# Patient Record
Sex: Male | Born: 1950 | Race: White | Hispanic: No | Marital: Married | Smoking: Former smoker
Health system: Southern US, Community
[De-identification: ages and names within clinical notes are randomized; demographics above are authoritative.]

## PROBLEM LIST (undated history)

## (undated) DIAGNOSIS — K219 Gastro-esophageal reflux disease without esophagitis: Secondary | ICD-10-CM

## (undated) DIAGNOSIS — M1990 Unspecified osteoarthritis, unspecified site: Secondary | ICD-10-CM

## (undated) DIAGNOSIS — Z860100 Personal history of colon polyps, unspecified: Secondary | ICD-10-CM

## (undated) DIAGNOSIS — F319 Bipolar disorder, unspecified: Secondary | ICD-10-CM

## (undated) DIAGNOSIS — I10 Essential (primary) hypertension: Secondary | ICD-10-CM

## (undated) DIAGNOSIS — Z8719 Personal history of other diseases of the digestive system: Secondary | ICD-10-CM

## (undated) DIAGNOSIS — Z9889 Other specified postprocedural states: Secondary | ICD-10-CM

## (undated) HISTORY — DX: Personal history of colon polyps, unspecified: Z86.0100

## (undated) HISTORY — DX: Personal history of colonic polyps: Z86.010

## (undated) HISTORY — DX: Unspecified osteoarthritis, unspecified site: M19.90

## (undated) HISTORY — DX: Gastro-esophageal reflux disease without esophagitis: K21.9

## (undated) HISTORY — DX: Bipolar disorder, unspecified: F31.9

## (undated) HISTORY — DX: Other specified postprocedural states: Z98.890

## (undated) HISTORY — PX: VASECTOMY: SHX75

## (undated) HISTORY — DX: Personal history of other diseases of the digestive system: Z87.19

---

## 2000-08-12 ENCOUNTER — Encounter: Admission: RE | Admit: 2000-08-12 | Discharge: 2000-08-12 | Payer: Self-pay | Admitting: Family Medicine

## 2000-08-12 ENCOUNTER — Encounter: Payer: Self-pay | Admitting: Family Medicine

## 2000-09-18 ENCOUNTER — Ambulatory Visit (HOSPITAL_BASED_OUTPATIENT_CLINIC_OR_DEPARTMENT_OTHER): Admission: RE | Admit: 2000-09-18 | Discharge: 2000-09-18 | Payer: Self-pay | Admitting: Otolaryngology

## 2001-07-06 ENCOUNTER — Encounter: Admission: RE | Admit: 2001-07-06 | Discharge: 2001-07-06 | Payer: Self-pay | Admitting: Family Medicine

## 2001-07-06 ENCOUNTER — Encounter: Payer: Self-pay | Admitting: Family Medicine

## 2001-07-21 ENCOUNTER — Ambulatory Visit (HOSPITAL_BASED_OUTPATIENT_CLINIC_OR_DEPARTMENT_OTHER): Admission: RE | Admit: 2001-07-21 | Discharge: 2001-07-21 | Payer: Self-pay | Admitting: Family Medicine

## 2001-08-04 ENCOUNTER — Encounter: Admission: RE | Admit: 2001-08-04 | Discharge: 2001-08-04 | Payer: Self-pay | Admitting: Family Medicine

## 2001-08-04 ENCOUNTER — Encounter: Payer: Self-pay | Admitting: Family Medicine

## 2001-08-24 ENCOUNTER — Ambulatory Visit (HOSPITAL_COMMUNITY): Admission: RE | Admit: 2001-08-24 | Discharge: 2001-08-24 | Payer: Self-pay | Admitting: *Deleted

## 2001-08-25 ENCOUNTER — Ambulatory Visit (HOSPITAL_BASED_OUTPATIENT_CLINIC_OR_DEPARTMENT_OTHER): Admission: RE | Admit: 2001-08-25 | Discharge: 2001-08-25 | Payer: Self-pay | Admitting: Family Medicine

## 2003-03-25 ENCOUNTER — Observation Stay (HOSPITAL_COMMUNITY): Admission: EM | Admit: 2003-03-25 | Discharge: 2003-03-26 | Payer: Self-pay | Admitting: *Deleted

## 2003-03-25 ENCOUNTER — Encounter: Payer: Self-pay | Admitting: Cardiology

## 2004-03-23 ENCOUNTER — Ambulatory Visit (HOSPITAL_BASED_OUTPATIENT_CLINIC_OR_DEPARTMENT_OTHER): Admission: RE | Admit: 2004-03-23 | Discharge: 2004-03-23 | Payer: Self-pay | Admitting: Orthopedic Surgery

## 2004-12-25 ENCOUNTER — Emergency Department (HOSPITAL_COMMUNITY): Admission: EM | Admit: 2004-12-25 | Discharge: 2004-12-25 | Payer: Self-pay | Admitting: Family Medicine

## 2005-09-30 HISTORY — PX: HERNIA REPAIR: SHX51

## 2006-09-19 ENCOUNTER — Ambulatory Visit (HOSPITAL_COMMUNITY): Admission: RE | Admit: 2006-09-19 | Discharge: 2006-09-19 | Payer: Self-pay | Admitting: Surgery

## 2011-06-17 ENCOUNTER — Encounter (INDEPENDENT_AMBULATORY_CARE_PROVIDER_SITE_OTHER): Payer: 59 | Admitting: Surgery

## 2011-07-15 ENCOUNTER — Encounter (INDEPENDENT_AMBULATORY_CARE_PROVIDER_SITE_OTHER): Payer: Self-pay | Admitting: Surgery

## 2011-07-15 ENCOUNTER — Ambulatory Visit (INDEPENDENT_AMBULATORY_CARE_PROVIDER_SITE_OTHER): Payer: 59 | Admitting: Surgery

## 2011-07-15 VITALS — BP 124/78 | HR 88 | Temp 97.6°F | Resp 20 | Ht 72.0 in | Wt 186.0 lb

## 2011-07-15 DIAGNOSIS — K4090 Unilateral inguinal hernia, without obstruction or gangrene, not specified as recurrent: Secondary | ICD-10-CM | POA: Insufficient documentation

## 2011-07-15 NOTE — Patient Instructions (Signed)
Inguinal Hernia, Adult Muscles help keep everything in the body in its proper place. But if a weak spot in the muscles develops, something can poke through. That is called a hernia. When this happens in the lower part of the belly (abdomen), it is called an inguinal hernia. (It takes its name from a part of the body in this region called the inguinal canal.) A weak spot in the wall of muscles lets some fat or part of the small intestine bulge through. An inguinal hernia can develop at any age. Men get them more often than women. CAUSES In adults, an inguinal hernia develops over time.  It can be triggered by:   Suddenly straining the muscles of the lower abdomen.   Lifting heavy objects.   Straining to have a bowel movement. Difficult bowel movements (constipation) can lead to this.   Constant coughing. This may be caused by smoking or lung disease.   Being overweight.   Being pregnant.   Working at a job that requires long periods of standing or heavy lifting.   Having had an inguinal hernia before.  One type can be an emergency situation. It is called a strangulated inguinal hernia. It develops if part of the small intestine slips through the weak spot and cannot get back into the abdomen. The blood supply can be cut off. If that happens, part of the intestine may die. This situation requires emergency surgery. SYMPTOMS Often, a small inguinal hernia has no symptoms. It is found when a healthcare provider does a physical exam. Larger hernias usually have symptoms.   In adults, symptoms may include:   A lump in the groin. This is easier to see when the person is standing. It might disappear when lying down.   In men, a lump in the scrotum.   Pain or burning in the groin. This occurs especially when lifting, straining or coughing.   A dull ache or feeling of pressure in the groin.   Signs of a strangulated hernia can include:   A bulge in the groin that becomes very painful and  tender to the touch.   A bulge that turns red or purple.   Fever, nausea and vomiting.   Inability to have a bowel movement or to pass gas.  DIAGNOSIS To decide if you have an inguinal hernia, a healthcare provider will probably do a physical examination.  This will include asking questions about any symptoms you have noticed.   The healthcare provider might feel the groin area and ask you to cough. If an inguinal hernia is felt, the healthcare provider may try to slide it back into the abdomen.   Usually no other tests are needed.  TREATMENT Treatments can vary. The size of the hernia makes a difference. Options include:  Watchful waiting. This is often suggested if the hernia is small and you have had no symptoms.   No medical procedure will be done unless symptoms develop.   You will need to watch closely for symptoms. If any occur, contact your healthcare provider right away.   Surgery. This is used if the hernia is larger or you have symptoms.   Open surgery. This is usually an outpatient procedure (you will not stay overnight in a hospital). An cut (incision) is made through the skin in the groin. The hernia is put back inside the abdomen. The weak area in the muscles is then repaired by:  --Herniorrhaphy. In this type of surgery, the weak muscles are sewn   back together. --Hernioplasty. A patch or mesh is used to close the weak area in the abdominal wall.   Laparoscopy. In this procedure, a surgeon makes small incisions. A thin tube with a tiny video camera (called a laparoscope) is put into the abdomen. The surgeon repairs the hernia with mesh by looking with the video camera and using two long instruments.  HOME CARE INSTRUCTIONS  After surgery to repair an inguinal hernia:   You will need to take pain medicine prescribed by your healthcare provider. Follow all directions carefully.   You will need to take care of the wound from the incision.   Your activity will be  restricted for awhile. This will probably include no heavy lifting for several weeks. You also should not do anything too active for a few weeks. When you can return to work will depend on the type of job that you have.   During "watchful waiting" periods, you should:   Maintain a healthy weight.   Eat a diet high in fiber (fruits, vegetables and whole grains).   Drink plenty of fluids to avoid constipation. This means drinking enough water and other liquids to keep your urine clear or pale yellow.   Do not lift heavy objects.   Do not stand for long periods of time.   Quit smoking. This should keep you from developing a frequent cough.  SEEK MEDICAL CARE IF:  A bulge develops in your groin area.   You feel pain, a burning sensation or pressure in the groin. This might be worse if you are lifting or straining.   You develop a fever of more than 100.5F (38.1 C).  SEEK IMMEDIATE MEDICAL CARE IF:  Pain in the groin increases suddenly.   A bulge in the groin gets bigger suddenly and does not go down.   For men, there is sudden pain in the scrotum. Or, the size of the scrotum increases.   A bulge in the groin area becomes red or purple and is painful to touch.   You have nausea or vomiting that does not go away.   You feel your heart beating much faster than normal.   You cannot have a bowel movement or pass gas.   You develop a fever of more than 102.0F (38.9C).  Document Released: 02/02/2009 Document Re-Released: 03/06/2010 [employer] Patient Information 2011 [employer]. 

## 2011-07-15 NOTE — Progress Notes (Signed)
Subjective:     Patient ID: Justin Waters, male   DOB: 05-12-1951, 60 y.o.   MRN: [id number]  HPI  Patient Care Team: Eddie Candle, PA as PCP - General (Internal Medicine)  This patient is a 60 y.o.male who presents today for surgical evaluation.   Reason for visit: Left groin pain. Probable hernia.  Patient is a pleasant gentleman that I repaired a right inguinal hernia on laparoscopically in 2007. He recovered from that well.  A month ago he started feeling discomfort in his left groin, a dull ache like a toothache. It is intermittent. It is worse with coughing, straining, and activity. It feels just like how his right inguinal hernia started.  No bouts of constipation or diarrhea. No dysuria or hematuria. Otherwise been okay.  He was evaluated at the [facility]. They suspected left inguinal hernia. They sent the patient to me for evaluation.   Past Medical History  Diagnosis Date  . Lt groin pain   . Bipolar 1 disorder     ?? off meds 2012    Past Surgical History  Procedure Date  . Hernia repair 2007    lap RIH TEP    History   Social History  . Marital Status: Married    Spouse Name: N/A    Number of Children: N/A  . Years of Education: N/A   Occupational History  . Not on file.   Social History Main Topics  . Smoking status: Former Games developer  . Smokeless tobacco: Not on file  . Alcohol Use: Yes  . Drug Use: No  . Sexually Active:    Other Topics Concern  . Not on file   Social History Narrative  . No narrative on file    History reviewed. No pertinent family history.  No current outpatient prescriptions on file.  No Known Allergies    Review of Systems  Constitutional: Negative for fever, chills and diaphoresis.  HENT: Negative for nosebleeds, sore throat, facial swelling, mouth sores, trouble swallowing and ear discharge.   Eyes: Negative for photophobia, discharge and visual disturbance.  Respiratory: Negative for choking, chest  tightness, shortness of breath and stridor.   Cardiovascular: Negative for chest pain, palpitations and leg swelling.       Walks 2 miles w/o difficulty  Gastrointestinal: Negative for nausea, vomiting, abdominal pain, diarrhea, constipation, blood in stool, abdominal distention, anal bleeding and rectal pain.  Genitourinary: Negative for dysuria, urgency, difficulty urinating and testicular pain.  Musculoskeletal: Negative for myalgias, back pain, arthralgias and gait problem.  Skin: Negative for color change, pallor, rash and wound.  Neurological: Negative for dizziness, speech difficulty, weakness, numbness and headaches.  Hematological: Negative for adenopathy. Does not bruise/bleed easily.  Psychiatric/Behavioral: Negative for hallucinations, confusion and agitation.       Objective:   Physical Exam  Constitutional: He is oriented to person, place, and time. He appears well-developed and well-nourished. No distress.  HENT:  Head: Normocephalic.  Mouth/Throat: Oropharynx is clear and moist. No oropharyngeal exudate.  Eyes: Conjunctivae and EOM are normal. Pupils are equal, round, and reactive to light. No scleral icterus.  Neck: Normal range of motion. Neck supple. No tracheal deviation present.  Cardiovascular: Normal rate, regular rhythm, normal heart sounds and intact distal pulses.   Pulmonary/Chest: Effort normal and breath sounds normal. No respiratory distress.  Abdominal: Soft. He exhibits no distension and no mass. There is no tenderness. Hernia confirmed negative in the right inguinal area.  Incisions clean with normal healing ridges.    Genitourinary:     Musculoskeletal: Normal range of motion. He exhibits no tenderness.  Lymphadenopathy:    He has no cervical adenopathy.       Right: No inguinal adenopathy present.       Left: No inguinal adenopathy present.  Neurological: He is alert and oriented to person, place, and time. No cranial nerve deficit. He  exhibits normal muscle tone. Coordination normal.  Skin: Skin is warm and dry. No rash noted. He is not diaphoretic. No erythema. No pallor.  Psychiatric: He has a normal mood and affect. His behavior is normal. Judgment and thought content normal.       Assessment:     LIH, poss pantaloon type    Plan:     The anatomy & physiology of the abdominal wall was discussed.  The pathophysiology of hernias was discussed.  Natural history risks without surgery of enlargement, pain, incarceration & strangulation was discussed.   Contributors to complications such as smoking, obesity, diabetes, prior surgery, etc were discussed.  I feel the risks of no intervention will lead to serious problems that outweigh the operative risks; therefore, I recommended surgery to reduce and repair the [facility] hernia.  I explained laparoscopic techniques with possible need for an open approach.  I noted the probable use of mesh to patch and/or buttress hernia repair.  It make take a little longer with the prior Lap RIH repair in 2007  Risks such as bleeding, infection, abscess, need for further treatment, heart attack, death, and other risks were discussed.  Goals of post-operative recovery were discussed as well.  Possibility that this will not correct all symptoms was explained.  I stressed the importance of low-impact activity, aggressive pain control, avoiding constipation, & not pushing through pain to minimize risk of post-operative chronic pain or injury. Possibility of reherniation was discussed.  We will work to minimize complications.   An educational handout further explaining the pathology & treatment options was given as well.  Questions were answered.  The patient expresses understanding & wishes to proceed with surgery.

## 2011-08-20 DIAGNOSIS — K4090 Unilateral inguinal hernia, without obstruction or gangrene, not specified as recurrent: Secondary | ICD-10-CM

## 2011-08-20 HISTORY — PX: HERNIA REPAIR: SHX51

## 2011-09-05 ENCOUNTER — Encounter (INDEPENDENT_AMBULATORY_CARE_PROVIDER_SITE_OTHER): Payer: Self-pay | Admitting: Surgery

## 2011-09-05 ENCOUNTER — Ambulatory Visit (INDEPENDENT_AMBULATORY_CARE_PROVIDER_SITE_OTHER): Payer: 59 | Admitting: Surgery

## 2011-09-05 VITALS — BP 134/76 | HR 76 | Temp 98.1°F | Resp 24 | Ht 72.0 in | Wt 191.4 lb

## 2011-09-05 DIAGNOSIS — K4090 Unilateral inguinal hernia, without obstruction or gangrene, not specified as recurrent: Secondary | ICD-10-CM

## 2011-09-05 NOTE — Patient Instructions (Signed)

## 2011-09-05 NOTE — Progress Notes (Signed)
Subjective:     Patient ID: Justin Waters, male   DOB: 10-27-1950, 60 y.o.   MRN: [id number]  HPI  Jessica Seidman [facility]  04/22/1951 [id number]  Patient Care Team: Eddie Candle, PA as PCP - General (Internal Medicine)  This patient is a 60 y.o.male who presents today for surgical evaluation.   Procedure: Laparoscopic left inguinal hernia 08/20/2011  The patient comes in today doing well. He never took any narcotics. He notes he was sore that first week but did not want to take anything. He is already back to work. He had some bruising but it is nearly resolved. Urinating fine. Regular bowel movements. Eating well. Has had some occasional sharp pains with sneezing and coughing but they are getting milder already.  Patient Active Problem List  Diagnoses  (none) - all problems resolved or deleted    Past Medical History  Diagnosis Date  . Bipolar 1 disorder     ?? off meds 2012  . History of inguinal hernia repair     Past Surgical History  Procedure Date  . Hernia repair 2007    lap RIH TEP  . Hernia repair 08/20/2011    lap LIH    History   Social History  . Marital Status: Married    Spouse Name: N/A    Number of Children: N/A  . Years of Education: N/A   Occupational History  . Not on file.   Social History Main Topics  . Smoking status: Former Games developer  . Smokeless tobacco: Not on file  . Alcohol Use: Yes  . Drug Use: No  . Sexually Active:    Other Topics Concern  . Not on file   Social History Narrative  . No narrative on file    History reviewed. No pertinent family history.  No current outpatient prescriptions on file.  No Known Allergies  BP 134/76  Pulse 76  Temp(Src) 98.1 F (36.7 C) (Temporal)  Resp 24  Ht 6' (1.829 m)  Wt 191 lb 6.4 oz (86.818 kg)  BMI 25.96 kg/m2     Review of Systems  Constitutional: Negative for fever, chills and diaphoresis.  HENT: Negative for sore throat, trouble swallowing and neck pain.   Eyes: Negative for  photophobia and visual disturbance.  Respiratory: Negative for choking and shortness of breath.   Cardiovascular: Negative for chest pain and palpitations.  Gastrointestinal: Negative for nausea, vomiting, abdominal distention, anal bleeding and rectal pain.  Genitourinary: Negative for dysuria, urgency, penile swelling, scrotal swelling, difficulty urinating, penile pain and testicular pain.  Musculoskeletal: Negative for myalgias, arthralgias and gait problem.  Skin: Negative for color change and rash.  Neurological: Negative for dizziness, speech difficulty, weakness and numbness.  Hematological: Negative for adenopathy.  Psychiatric/Behavioral: Negative for hallucinations, confusion and agitation.       Objective:   Physical Exam  Constitutional: He is oriented to person, place, and time. He appears well-developed and well-nourished. No distress.  HENT:  Head: Normocephalic.  Mouth/Throat: Oropharynx is clear and moist. No oropharyngeal exudate.  Eyes: Conjunctivae and EOM are normal. Pupils are equal, round, and reactive to light. No scleral icterus.  Neck: Normal range of motion. No tracheal deviation present.  Cardiovascular: Normal rate, normal heart sounds and intact distal pulses.   Pulmonary/Chest: Effort normal. No respiratory distress.  Abdominal: Soft. He exhibits no distension. There is no tenderness. Hernia confirmed negative in the right inguinal area and confirmed negative in the left inguinal area.  Incisions clean with normal healing ridges.  No hernias  Genitourinary: Penis normal. Right testis shows no mass. Left testis shows no mass. Circumcised. No penile tenderness.       Mild L thigh ecchymosis - nearly gone  Musculoskeletal: Normal range of motion. He exhibits no tenderness.  Neurological: He is alert and oriented to person, place, and time. No cranial nerve deficit. He exhibits normal muscle tone. Coordination normal.  Skin: Skin is warm and dry. No rash  noted. He is not diaphoretic.  Psychiatric: He has a normal mood and affect. His behavior is normal.       Assessment:     2 weeks s/p lap LIH repair with prior lap RIH, recovering well    Plan:     Increase activity as tolerated.  Do not push through pain.  Advanced on diet as tolerated. Bowel regimen to avoid problems.  Return to clinic p.r.n. The patient expressed understanding and appreciation

## 2012-01-03 ENCOUNTER — Encounter (INDEPENDENT_AMBULATORY_CARE_PROVIDER_SITE_OTHER): Payer: Self-pay | Admitting: Surgery

## 2012-01-23 ENCOUNTER — Ambulatory Visit (INDEPENDENT_AMBULATORY_CARE_PROVIDER_SITE_OTHER): Payer: 59 | Admitting: Family Medicine

## 2012-01-23 VITALS — BP 164/77 | HR 69 | Temp 98.3°F | Resp 16 | Ht 70.5 in | Wt 185.4 lb

## 2012-01-23 DIAGNOSIS — IMO0001 Reserved for inherently not codable concepts without codable children: Secondary | ICD-10-CM

## 2012-01-23 DIAGNOSIS — Z0000 Encounter for general adult medical examination without abnormal findings: Secondary | ICD-10-CM

## 2012-01-23 DIAGNOSIS — M542 Cervicalgia: Secondary | ICD-10-CM

## 2012-01-23 DIAGNOSIS — R5383 Other fatigue: Secondary | ICD-10-CM

## 2012-01-23 DIAGNOSIS — G8929 Other chronic pain: Secondary | ICD-10-CM

## 2012-01-23 DIAGNOSIS — Z23 Encounter for immunization: Secondary | ICD-10-CM

## 2012-01-23 LAB — POCT CBC
Granulocyte percent: 65.8 %G (ref 37–80)
HCT, POC: 45.5 % (ref 43.5–53.7)
Hemoglobin: 14.3 g/dL (ref 14.1–18.1)
Lymph, poc: 2.9 (ref 0.6–3.4)
MCH, POC: 27.4 pg (ref 27–31.2)
MCHC: 31.4 g/dL — AB (ref 31.8–35.4)
MCV: 87.2 fL (ref 80–97)
MID (cbc): 0.8 (ref 0–0.9)
MPV: 9.2 fL (ref 0–99.8)
POC Granulocyte: 7 — AB (ref 2–6.9)
POC LYMPH PERCENT: 26.9 %L (ref 10–50)
POC MID %: 7.3 %M (ref 0–12)
Platelet Count, POC: 310 10*3/uL (ref 142–424)
RBC: 5.22 M/uL (ref 4.69–6.13)
RDW, POC: 15 %
WBC: 10.6 10*3/uL — AB (ref 4.6–10.2)

## 2012-01-23 LAB — IFOBT (OCCULT BLOOD): IFOBT: NEGATIVE

## 2012-01-23 MED ORDER — CYCLOBENZAPRINE HCL 10 MG PO TABS: 10.0000 mg | ORAL_TABLET | Freq: Every evening | ORAL | Status: DC | PRN

## 2012-01-23 MED ORDER — MELOXICAM 7.5 MG PO TABS: 7.5000 mg | ORAL_TABLET | Freq: Two times a day (BID) | ORAL | Status: DC

## 2012-01-23 MED ORDER — GABAPENTIN 100 MG PO CAPS: ORAL_CAPSULE | ORAL | Status: DC

## 2012-01-23 NOTE — Progress Notes (Signed)
  Subjective:    Patient ID: Justin Waters, male    DOB: 07/23/1951, 61 y.o.   MRN: [id number]  HPI Patient presents for CPE with several concerns  1) Fatigue- last several weeks; comes home from work and takes nap then eats dinner and goes to bed. History of mood disorder and is currently off medications. Denies depressive symptoms.  2) Chronic neck and thorasic back pain- no known injury; patient denies radiation of pain to UE. X rays in the past were normal per patient.  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  Review of Systems    see intake form Objective:   Physical Exam  Constitutional: He appears well-developed.  HENT:  Head: Normocephalic.  Right Ear: External ear normal.  Left Ear: External ear normal.  Nose: Nose normal.  Mouth/Throat: Oropharynx is clear and moist.  Eyes: Conjunctivae and EOM are normal. Pupils are equal, round, and reactive to light.  Neck: Normal range of motion. Neck supple. No thyromegaly present.  Cardiovascular: Normal rate, regular rhythm, normal heart sounds and intact distal pulses.   Pulmonary/Chest: Effort normal and breath sounds normal.  Abdominal: Soft. Bowel sounds are normal. There is no hepatosplenomegaly.  Genitourinary: Rectum normal and prostate normal. Guaiac negative stool.  Musculoskeletal: Normal range of motion.  Lymphadenopathy:    He has no cervical adenopathy.  Neurological: He is alert. He displays normal reflexes. No cranial nerve deficit. He exhibits normal muscle tone. Coordination normal.  Skin: Skin is warm.  Psychiatric: He has a normal mood and affect.          Assessment & Plan:   1. Routine general medical examination at a health care facility  Tdap vaccine greater than or equal to 7yo IM, Comprehensive metabolic panel, Lipid panel, PSA, TSH, Testosterone, POCT CBC, IFOBT POC (occult bld, rslt in  office), Ambulatory referral to Gastroenterology  2. Chronic neck pain  meloxicam (MOBIC) 7.5 MG tablet, cyclobenzaprine (FLEXERIL) 10 MG tablet, gabapentin (NEURONTIN) 100 MG capsule, Ambulatory referral to Physical Medicine Rehab  3. Fatigue    4. Elevated BP     Anticipatory guidance

## 2012-01-24 LAB — LIPID PANEL
Cholesterol: 225 mg/dL — ABNORMAL HIGH (ref 0–200)
HDL: 50 mg/dL (ref >39)
LDL Cholesterol: 156 mg/dL — ABNORMAL HIGH (ref 0–99)
Total CHOL/HDL Ratio: 4.5 Ratio
Triglycerides: 95 mg/dL (ref <150)
VLDL: 19 mg/dL (ref 0–40)

## 2012-01-24 LAB — COMPREHENSIVE METABOLIC PANEL
ALT: 17 U/L (ref 0–53)
AST: 23 U/L (ref 0–37)
Albumin: 5 g/dL (ref 3.5–5.2)
Alkaline Phosphatase: 85 U/L (ref 39–117)
BUN: 19 mg/dL (ref 6–23)
CO2: 30 mEq/L (ref 19–32)
Calcium: 9.7 mg/dL (ref 8.4–10.5)
Chloride: 107 mEq/L (ref 96–112)
Creat: 1.03 mg/dL (ref 0.50–1.35)
Glucose, Bld: 117 mg/dL — ABNORMAL HIGH (ref 70–99)
Potassium: 4.5 mEq/L (ref 3.5–5.3)
Sodium: 143 mEq/L (ref 135–145)
Total Bilirubin: 0.4 mg/dL (ref 0.3–1.2)
Total Protein: 7.2 g/dL (ref 6.0–8.3)

## 2012-01-24 LAB — TESTOSTERONE: Testosterone: 265.75 ng/dL — ABNORMAL LOW (ref 300–890)

## 2012-01-24 LAB — PSA: PSA: 1.96 ng/mL (ref <=4.00)

## 2012-01-24 LAB — TSH: TSH: 3.107 u[IU]/mL (ref 0.350–4.500)

## 2012-01-26 ENCOUNTER — Encounter: Payer: Self-pay | Admitting: Family Medicine

## 2012-01-30 ENCOUNTER — Telehealth: Payer: Self-pay | Admitting: Family Medicine

## 2012-01-30 NOTE — Telephone Encounter (Signed)
LM asking for return call.

## 2012-01-30 NOTE — Telephone Encounter (Signed)
Spoke with patient and reviewed labs. Patient to RTC to recheck BP, BS and initiate therapy for hypogonadism.

## 2012-01-30 NOTE — Telephone Encounter (Signed)
.  umfc The patient called to return Dr. Wendee Copp phone call to the patient.  Please call patient at [phone].

## 2012-01-31 ENCOUNTER — Ambulatory Visit (INDEPENDENT_AMBULATORY_CARE_PROVIDER_SITE_OTHER): Payer: 59 | Admitting: Family Medicine

## 2012-01-31 VITALS — BP 148/70 | HR 96 | Temp 98.3°F | Resp 16 | Ht 71.0 in | Wt 184.0 lb

## 2012-01-31 DIAGNOSIS — R739 Hyperglycemia, unspecified: Secondary | ICD-10-CM

## 2012-01-31 DIAGNOSIS — E236 Other disorders of pituitary gland: Secondary | ICD-10-CM

## 2012-01-31 DIAGNOSIS — E291 Testicular hypofunction: Secondary | ICD-10-CM

## 2012-01-31 DIAGNOSIS — E785 Hyperlipidemia, unspecified: Secondary | ICD-10-CM

## 2012-01-31 DIAGNOSIS — M542 Cervicalgia: Secondary | ICD-10-CM

## 2012-01-31 DIAGNOSIS — IMO0001 Reserved for inherently not codable concepts without codable children: Secondary | ICD-10-CM

## 2012-01-31 DIAGNOSIS — I10 Essential (primary) hypertension: Secondary | ICD-10-CM

## 2012-01-31 LAB — GLUCOSE, POCT (MANUAL RESULT ENTRY): POC Glucose: 90

## 2012-01-31 MED ORDER — PRAVASTATIN SODIUM 40 MG PO TABS: 40.0000 mg | ORAL_TABLET | Freq: Every day | ORAL | Status: DC

## 2012-01-31 MED ORDER — TESTOSTERONE 50 MG/5GM (1%) TD GEL: 5.0000 g | Freq: Every day | TRANSDERMAL | Status: DC

## 2012-02-02 NOTE — Progress Notes (Signed)
  Subjective:    Patient ID: Justin Waters, male    DOB: [birth year], 61 y.o.   MRN: [id number]  HPI  Patient presents in follow up of CPE.  Neck pain much improved with Neurontin and flexeril   Review of Systems     Objective:   Physical Exam  Nursing note and vitals reviewed. Constitutional: He appears well-developed.  Neurological: He is alert.     Results for orders placed in visit on 01/31/12  GLUCOSE, POCT (MANUAL RESULT ENTRY)      Component Value Range   POC Glucose 90          Assessment & Plan:   1. Elevated blood sugar  POCT glucose (manual entry)  2. Dyslipidemia  pravastatin (PRAVACHOL) 40 MG tablet  3. Hypogonadism male    4. Neck pain    5. Elevated BP      See medications on AVS Follow up in 8 weeks to recheck lipid/liver/testosterone

## 2012-02-05 ENCOUNTER — Telehealth: Payer: Self-pay

## 2012-02-05 NOTE — Telephone Encounter (Signed)
Called [facility]. Androgel is not covered. Testin is covered. Please advise.

## 2012-02-05 NOTE — Telephone Encounter (Signed)
[facility] called for this patient.  Said he saw Hal Hope and that the medicine she prescribed him is not covered by his insurance.  He told her that Dr. Hal Hope said for them to call us about this.  Please call [facility] at [phone]

## 2012-02-06 MED ORDER — TESTOSTERONE 50 MG/5GM (1%) TD GEL: 5.0000 g | Freq: Every day | TRANSDERMAL | Status: DC

## 2012-02-06 NOTE — Telephone Encounter (Signed)
I sent in Rx for Testim (but it came up as androgel?)-lets see if this is the right one.

## 2012-02-06 NOTE — Telephone Encounter (Signed)
It looks like this is exactly the same as Dr Hal Hope sent in [place], so I don't think it will be covered.

## 2012-02-07 NOTE — Telephone Encounter (Signed)
Called pharmacist to verbally change pts Rx to Testim. He reported that Marylene Land had called yesterday to change it by phone when [employer] wouldn't allow change.

## 2012-02-07 NOTE — Telephone Encounter (Signed)
That is the only option in epic.

## 2012-02-26 ENCOUNTER — Other Ambulatory Visit: Payer: Self-pay | Admitting: Family Medicine

## 2012-03-08 ENCOUNTER — Other Ambulatory Visit: Payer: Self-pay | Admitting: Family Medicine

## 2012-04-10 LAB — HM COLONOSCOPY

## 2012-04-15 ENCOUNTER — Other Ambulatory Visit: Payer: Self-pay | Admitting: Family Medicine

## 2012-05-22 ENCOUNTER — Other Ambulatory Visit: Payer: Self-pay | Admitting: Physician Assistant

## 2012-05-25 ENCOUNTER — Other Ambulatory Visit: Payer: Self-pay | Admitting: Physician Assistant

## 2012-06-08 ENCOUNTER — Encounter: Payer: Self-pay | Admitting: *Deleted

## 2012-06-09 ENCOUNTER — Encounter: Payer: Self-pay | Admitting: *Deleted

## 2012-06-09 DIAGNOSIS — K219 Gastro-esophageal reflux disease without esophagitis: Secondary | ICD-10-CM | POA: Insufficient documentation

## 2012-06-09 DIAGNOSIS — Z86010 Personal history of colonic polyps: Secondary | ICD-10-CM

## 2012-06-23 ENCOUNTER — Other Ambulatory Visit: Payer: Self-pay

## 2012-06-23 ENCOUNTER — Telehealth: Payer: Self-pay

## 2012-06-23 MED ORDER — TESTOSTERONE 50 MG/5GM (1%) TD GEL: 5.0000 g | Freq: Every day | TRANSDERMAL | Status: DC

## 2012-06-23 NOTE — Telephone Encounter (Signed)
[facility] is calling about recent rx for androgel. He was previously on testum? and androgel is not covered by insurance so she is wondering why the change and if he can go back to the testum?  Lequita Halt at [employer] # [phone]

## 2012-06-24 NOTE — Telephone Encounter (Signed)
Can you verify RX, he needs to be on Testim 50 mg each morning. I need quantity, number of refills? Please advise

## 2012-06-25 MED ORDER — TESTOSTERONE 50 MG/5GM (1%) TD GEL: 5.0000 g | Freq: Every day | TRANSDERMAL | Status: DC

## 2012-06-25 NOTE — Telephone Encounter (Signed)
Sending Rx for Testim through [employer] and LM on MD VM at [facility] to let them know we are sending in new Rx for Testim.

## 2012-06-25 NOTE — Telephone Encounter (Signed)
Please call in Testim Apply 50 mg each morning. #30 tubes no RF

## 2012-07-11 ENCOUNTER — Ambulatory Visit (INDEPENDENT_AMBULATORY_CARE_PROVIDER_SITE_OTHER): Payer: 59 | Admitting: Emergency Medicine

## 2012-07-11 VITALS — BP 120/69 | HR 83 | Temp 98.4°F | Resp 16 | Ht 71.5 in | Wt 183.4 lb

## 2012-07-11 DIAGNOSIS — F988 Other specified behavioral and emotional disorders with onset usually occurring in childhood and adolescence: Secondary | ICD-10-CM

## 2012-07-11 MED ORDER — AMPHETAMINE-DEXTROAMPHETAMINE 20 MG PO TABS: 20.0000 mg | ORAL_TABLET | Freq: Every day | ORAL | Status: DC

## 2012-07-11 NOTE — Progress Notes (Signed)
[facility] [address], [place] [address] [phone]  Date:  07/11/2012   Name:  Justin Waters   DOB:  1951/06/15   MRN:  [id number]  PCP:  Nicole Kindred    Chief Complaint: ADD   History of Present Illness:  Justin Waters is a 61 y.o. very pleasant male patient who presents with the following:  Gives a history of childhood difficulty reading and retaining focus at school.  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  Has a great deal of difficulty with retention of information that he has read and remaining focused during study and computer work at school.  Patient Active Problem List  Diagnosis  . GERD (gastroesophageal reflux disease)  . Hx of colonic polyps    Past Medical History  Diagnosis Date  . Bipolar 1 disorder     ?? off meds 2012  . History of inguinal hernia repair   . GERD (gastroesophageal reflux disease)   . Hx of colonic polyps     Past Surgical History  Procedure Date  . Hernia repair 2007    lap RIH TEP  . Hernia repair 08/20/2011    lap LIH    History  Substance Use Topics  . Smoking status: Former Games developer  . Smokeless tobacco: Former Neurosurgeon    Quit date: 12/10/2011  . Alcohol Use: Yes    No family history on file.  No Known Allergies  Medication list has been reviewed and updated.  Current Outpatient Prescriptions on File Prior to Visit  Medication Sig Dispense Refill  . meloxicam (MOBIC) 7.5 MG tablet TAKE 1 TABLET BY MOUTH 2 (TWO) TIMES DAILY.  30 tablet  1  . pravastatin (PRAVACHOL) 40 MG tablet Take 1 tablet (40 mg total) by mouth daily.  90 tablet  3  . testosterone (ANDROGEL) 50 MG/5GM GEL Place 5 g onto the skin daily.  30 Tube  0  . cyclobenzaprine (FLEXERIL) 10 MG tablet TAKE 1 TABLET (10 MG TOTAL) BY MOUTH AT BEDTIME AS NEEDED FOR MUSCLE SPASMS.  30 tablet  0  . gabapentin (NEURONTIN) 100 MG capsule 1 CAPSULE AT BEDTIME FOR 1 WEEK THEN 2 CAPS AT BEDTIME FOR 1  WEEK THEN 3 CAPS AT BEDTIME  90 capsule  1  . ibuprofen (ADVIL,MOTRIN) 200 MG tablet Take 200 mg by mouth every 6 (six) hours as needed.        Review of Systems:  As per HPI, otherwise negative.    Physical Examination: Filed Vitals:   07/11/12 1501  BP: 120/69  Pulse: 83  Temp: 98.4 F (36.9 C)  Resp: 16   Filed Vitals:   07/11/12 1501  Height: 5' 11.5" (1.816 m)  Weight: 183 lb 6.4 oz (83.19 kg)   Body mass index is 25.22 kg/(m^2). Ideal Body Weight: Weight in (lb) to have BMI = 25: 181.4   GEN: WDWN, NAD, Non-toxic, A & O x 3 HEENT: Atraumatic, Normocephalic. Neck supple. No masses, No LAD. Ears and Nose: No external deformity. CV: RRR, No M/G/R. No JVD. No thrill. No extra heart sounds. PULM: CTA B, no wheezes, crackles, rhonchi. No retractions. No resp. distress. No accessory muscle use. ABD: S, NT, ND, +BS. No rebound. No HSM. EXTR: No c/c/e NEURO Normal gait.  PSYCH: Normally interactive. Conversant. Not depressed or anxious appearing.  Calm demeanor.    Assessment and Plan: ADD  Carmelina Dane, MD  I have  reviewed and agree with documentation. Robert P. Merla Riches, M.D.

## 2012-07-20 NOTE — Addendum Note (Signed)
Addended by: Carmelina Dane on: 07/20/2012 10:03 AM   Modules accepted: Orders

## 2012-07-28 ENCOUNTER — Other Ambulatory Visit: Payer: Self-pay | Admitting: Physician Assistant

## 2012-08-23 ENCOUNTER — Ambulatory Visit (INDEPENDENT_AMBULATORY_CARE_PROVIDER_SITE_OTHER): Payer: 59 | Admitting: Family Medicine

## 2012-08-23 VITALS — BP 123/68 | HR 80 | Temp 98.1°F | Resp 18 | Ht 71.0 in | Wt 182.0 lb

## 2012-08-23 DIAGNOSIS — L0390 Cellulitis, unspecified: Secondary | ICD-10-CM

## 2012-08-23 DIAGNOSIS — L0291 Cutaneous abscess, unspecified: Secondary | ICD-10-CM

## 2012-08-23 MED ORDER — CEFTRIAXONE SODIUM 1 G IJ SOLR
1.0000 g | INTRAMUSCULAR | Status: DC
  Administered 2012-08-23: 1 g via INTRAMUSCULAR

## 2012-08-23 MED ORDER — CEPHALEXIN 500 MG PO TABS: 500.0000 mg | ORAL_TABLET | Freq: Four times a day (QID) | ORAL | Status: DC

## 2012-08-23 NOTE — Progress Notes (Signed)
  Subjective:    Patient ID: Justin Waters, male    DOB: [birth year], 61 y.o.   MRN: [id number]  HPI  Sev d ago pt developed a little pimple on his mid upper back that grew and extended. Is not to painful or itching, draining serous fluid, no pus. Never had similar lesion prior.  Past Medical History  Diagnosis Date  . Bipolar 1 disorder     ?? off meds 2012  . History of inguinal hernia repair   . GERD (gastroesophageal reflux disease)   . Hx of colonic polyps    Current Outpatient Prescriptions on File Prior to Visit  Medication Sig Dispense Refill  . TESTIM 50 MG/5GM GEL APPLY 5 GRAMS ONTO THE SKIN DAILY  150 g  0  . ibuprofen (ADVIL,MOTRIN) 200 MG tablet Take 200 mg by mouth every 6 (six) hours as needed.      . pravastatin (PRAVACHOL) 40 MG tablet Take 1 tablet (40 mg total) by mouth daily.  90 tablet  3   No current facility-administered medications on file prior to visit.   Review of Systems  Constitutional: Negative for fever, chills, activity change and appetite change.  Cardiovascular: Negative for leg swelling.  Gastrointestinal: Negative for nausea, vomiting, abdominal pain, diarrhea and constipation.  Musculoskeletal: Positive for back pain. Negative for myalgias, joint swelling and gait problem.  Skin: Positive for rash and wound.  Neurological: Negative for weakness and numbness.  Hematological: Negative for adenopathy. Does not bruise/bleed easily.      BP 123/68  Pulse 80  Temp 98.1 F (36.7 C) (Oral)  Resp 18  Ht 5\' 11"  (1.803 m)  Wt 182 lb (82.555 kg)  BMI 25.38 kg/m2 Objective:   Physical Exam  Constitutional: He is oriented to person, place, and time. He appears well-developed and well-nourished. No distress.  HENT:  Head: Normocephalic and atraumatic.  Eyes: No scleral icterus.  Pulmonary/Chest: Effort normal.  Neurological: He is alert and oriented to person, place, and time.  Skin: Skin is warm and dry. Rash noted. Rash is maculopapular and  vesicular. He is not diaphoretic.          Two poorly defined mildly elev erythema plaques of induration with vesicles and crusting over superior lesion which is slightly larger - approx 2-3 in dm. No fluctuance, lesion moist w/ minimal serous drainage. Faint poorly defined mildly erythematous macules extending diffusely onto back - across scapula.  Psychiatric: He has a normal mood and affect. His behavior is normal.       Assessment & Plan:   1. Cellulitis  cefTRIAXone (ROCEPHIN) injection 1 g x 1 now.  Start keflex 500mg  qid. Recheck in 48 hrs to ensure improving and no collection needs to be drained- fast track card given.  If sxs worsen, call as may need to add in MRSA coverage - Bactrim.

## 2012-08-25 ENCOUNTER — Ambulatory Visit (INDEPENDENT_AMBULATORY_CARE_PROVIDER_SITE_OTHER): Payer: 59 | Admitting: Family Medicine

## 2012-08-25 ENCOUNTER — Encounter: Payer: Self-pay | Admitting: Family Medicine

## 2012-08-25 VITALS — BP 128/74 | HR 83 | Temp 98.2°F | Resp 18 | Ht 68.0 in | Wt 182.0 lb

## 2012-08-25 DIAGNOSIS — L03312 Cellulitis of back [any part except buttock]: Secondary | ICD-10-CM

## 2012-08-25 DIAGNOSIS — L02219 Cutaneous abscess of trunk, unspecified: Secondary | ICD-10-CM

## 2012-08-25 NOTE — Progress Notes (Signed)
  Subjective:    Patient ID: Justin Waters, male    DOB: 02-Nov-1950, 61 y.o.   MRN: [id number]  HPI  Seems to be doing better.  Very difficult for pt to assess due to location - can barely see it in the mirror - but not draining anymore, hard for him to reach to cover the lesion.  No sig pain or itching.    Review of Systems  Constitutional: Negative for fever, chills, activity change and appetite change.  Gastrointestinal: Negative for nausea, vomiting, abdominal pain, diarrhea and constipation.  Musculoskeletal: Negative for myalgias, joint swelling and gait problem.  Skin: Positive for rash and wound.  Neurological: Negative for weakness and numbness.  Hematological: Negative for adenopathy. Does not bruise/bleed easily.      BP 128/74  Pulse 83  Temp 98.2 F (36.8 C) (Oral)  Resp 18  Ht 5\' 8"  (1.727 m)  Wt 182 lb (82.555 kg)  BMI 27.67 kg/m2  SpO2 96% Objective:   Physical Exam Constitutional: Justin Waters is oriented to person, place, and time. Justin Waters appears well-developed and well-nourished. No distress.  HENT:  Head: Normocephalic and atraumatic.  Eyes: No scleral icterus.  Pulmonary/Chest: Effort normal.  Neurological: Justin Waters is alert and oriented to person, place, and time.  Skin: Skin is warm and dry. Rash noted. Rash is maculopapular and vesicular. Justin Waters is not diaphoretic.       Two poorly defined mildly elev erythema plaques of induration with vesicles and crusting over superior lesion which is slightly larger - approx 2 in dm. No fluctuance, lesion dry with crusting. Few excoriated papules of dried blood across upper back.  Psychiatric: Justin Waters has a normal mood and affect. His behavior is normal.      Assessment & Plan:   1. Cellulitis of back   Dressing applied w/ xeroform. Seems to be improving so cont Keflex. Recheck in 3d to ensure continued resolution, RTC sooner if worsening. Fasttrack card given.

## 2012-08-30 ENCOUNTER — Other Ambulatory Visit: Payer: Self-pay | Admitting: Physician Assistant

## 2012-08-30 NOTE — Telephone Encounter (Signed)
Rx at tl desk.

## 2012-10-04 ENCOUNTER — Other Ambulatory Visit: Payer: Self-pay | Admitting: Physician Assistant

## 2012-10-10 ENCOUNTER — Other Ambulatory Visit: Payer: Self-pay | Admitting: Physician Assistant

## 2012-11-19 ENCOUNTER — Other Ambulatory Visit: Payer: Self-pay | Admitting: Physician Assistant

## 2013-02-17 ENCOUNTER — Ambulatory Visit (INDEPENDENT_AMBULATORY_CARE_PROVIDER_SITE_OTHER): Payer: 59 | Admitting: Family Medicine

## 2013-02-17 VITALS — BP 146/72 | HR 85 | Temp 98.2°F | Resp 16 | Ht 71.0 in | Wt 178.4 lb

## 2013-02-17 DIAGNOSIS — Z0000 Encounter for general adult medical examination without abnormal findings: Secondary | ICD-10-CM

## 2013-02-17 DIAGNOSIS — J309 Allergic rhinitis, unspecified: Secondary | ICD-10-CM

## 2013-02-17 DIAGNOSIS — M7710 Lateral epicondylitis, unspecified elbow: Secondary | ICD-10-CM

## 2013-02-17 DIAGNOSIS — H6123 Impacted cerumen, bilateral: Secondary | ICD-10-CM

## 2013-02-17 DIAGNOSIS — Z23 Encounter for immunization: Secondary | ICD-10-CM

## 2013-02-17 DIAGNOSIS — M7711 Lateral epicondylitis, right elbow: Secondary | ICD-10-CM

## 2013-02-17 DIAGNOSIS — E291 Testicular hypofunction: Secondary | ICD-10-CM

## 2013-02-17 DIAGNOSIS — R7989 Other specified abnormal findings of blood chemistry: Secondary | ICD-10-CM

## 2013-02-17 DIAGNOSIS — E785 Hyperlipidemia, unspecified: Secondary | ICD-10-CM

## 2013-02-17 DIAGNOSIS — H6120 Impacted cerumen, unspecified ear: Secondary | ICD-10-CM

## 2013-02-17 LAB — POCT URINALYSIS DIPSTICK
Bilirubin, UA: NEGATIVE
Blood, UA: NEGATIVE
Glucose, UA: NEGATIVE
Ketones, UA: NEGATIVE
Leukocytes, UA: NEGATIVE
Nitrite, UA: NEGATIVE
Protein, UA: NEGATIVE
Spec Grav, UA: 1.02
Urobilinogen, UA: 0.2
pH, UA: 6

## 2013-02-17 LAB — POCT CBC
Granulocyte percent: 67.5 %G (ref 37–80)
HCT, POC: 48.5 % (ref 43.5–53.7)
Hemoglobin: 15.4 g/dL (ref 14.1–18.1)
Lymph, poc: 2.3 (ref 0.6–3.4)
MCH, POC: 30 pg (ref 27–31.2)
MCHC: 31.8 g/dL (ref 31.8–35.4)
MCV: 94.5 fL (ref 80–97)
MID (cbc): 0.5 (ref 0–0.9)
MPV: 9 fL (ref 0–99.8)
POC Granulocyte: 5.9 (ref 2–6.9)
POC LYMPH PERCENT: 26.6 %L (ref 10–50)
POC MID %: 5.9 %M (ref 0–12)
Platelet Count, POC: 288 10*3/uL (ref 142–424)
RBC: 5.13 M/uL (ref 4.69–6.13)
RDW, POC: 12.9 %
WBC: 8.7 10*3/uL (ref 4.6–10.2)

## 2013-02-17 LAB — IFOBT (OCCULT BLOOD): IFOBT: NEGATIVE

## 2013-02-17 MED ORDER — PRAVASTATIN SODIUM 40 MG PO TABS: 40.0000 mg | ORAL_TABLET | Freq: Every day | ORAL | Status: DC

## 2013-02-17 NOTE — Progress Notes (Signed)
Subjective:    Patient ID: Justin Waters, male    DOB: Dec 04, 1950, 62 y.o.   MRN: [id number]  HPI Justin Waters is a 62 y.o. male Here for CPE.  Last CPE last April. Also other concerns today.  Prior primary provider - Dr. Hal Hope, or whoever.   Colonoscopy - 03/2012 - Dr. Loreta Ave, hperplastic polyps, tubular adenoma - possible 5 year repeat.  Tdap - 01/23/12.  Has not had zostavax, but would like this.  Not fasting today. - last ate around 6 hours ago.  Last PSA 01/23/12-  1.96.  Hyperlipidemia - takes pravastatin 40mg  qd. No recent missed doses, no new myalgias. Last checked lipids in April 2013 prior to starting pravastatin. LDL 156, HDL 50.   Hx low testosterone: prior on Testim 50mg  QD. Ran out few months ago.  Didn't feel like dose high enough, still had fatigue, erectile dysfunction - trouble obtaining erection, but did not follow up once on meds for recheck levels. No side effects on meds.   Fluid build up in ears - on and off in past - current sx's past week. Some congestion and runny nose. No sinus pain, no fever. Has had seasonal allergies possibly.  No treatments.   R Elbow pain. - long time- for years.  Treated in past with splint. Has continued to use splint since December,  No improvement. No injections.  No PT. R hand dominant.   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  Review of Systems  Constitutional: Positive for diaphoresis (night sweats in past - less recently. ) and fatigue.  Genitourinary: Negative for difficulty urinating (slower at times. chronic. ).  Musculoskeletal: Positive for arthralgias.  Neurological: Positive for weakness (generalized fatigue only. ).   13 point review of systems per patient health survey noted.  Negative other than as indicated    Objective:   Physical Exam  Vitals reviewed. Constitutional: He is oriented to person, place, and time. He appears well-developed and well-nourished.  HENT:  Head: Atraumatic. Macrocephalic.  Right Ear: External ear  normal.  Left Ear: External ear normal.  Nose: Mucosal edema (minimal) present.  Mouth/Throat: Oropharynx is clear and moist.  Cerumen nearly obstrucitive bilaterally. Unable to visualize tm.   Eyes: Conjunctivae and EOM are normal. Pupils are equal, round, and reactive to light.  Neck: Normal range of motion. Neck supple. No thyromegaly present.  Cardiovascular: Normal rate, regular rhythm, normal heart sounds and intact distal pulses.   Pulmonary/Chest: Effort normal and breath sounds normal. No respiratory distress. He has no wheezes.  Abdominal: Soft. He exhibits no distension. There is no tenderness. Hernia confirmed negative in the right inguinal area and confirmed negative in the left inguinal area.  Genitourinary: Prostate normal. Right testis shows no mass and no swelling. Left testis shows no mass and no swelling.  Musculoskeletal: Normal range of motion. He exhibits tenderness. He exhibits no edema.  Lymphadenopathy:    He has no cervical adenopathy.  Neurological: He is alert and oriented to person, place, and time. He has normal reflexes.  Skin: Skin is warm and dry.  Psychiatric: He has a normal mood and affect. His behavior is normal.    Results for orders placed in visit on 02/17/13  POCT CBC      Result Value Range   WBC 8.7  4.6 - 10.2 K/uL   Lymph, poc 2.3  0.6 - 3.4   POC LYMPH PERCENT 26.6  10 - 50 %L   MID (cbc) 0.5  0 - 0.9  POC MID % 5.9  0 - 12 %M   POC Granulocyte 5.9  2 - 6.9   Granulocyte percent 67.5  37 - 80 %G   RBC 5.13  4.69 - 6.13 M/uL   Hemoglobin 15.4  14.1 - 18.1 g/dL   HCT, POC 60.4  54.0 - 53.7 %   MCV 94.5  80 - 97 fL   MCH, POC 30.0  27 - 31.2 pg   MCHC 31.8  31.8 - 35.4 g/dL   RDW, POC 98.1     Platelet Count, POC 288  142 - 424 K/uL   MPV 9.0  0 - 99.8 fL  IFOBT (OCCULT BLOOD)      Result Value Range   IFOBT Negative    POCT URINALYSIS DIPSTICK      Result Value Range   Color, UA yellow     Clarity, UA clear     Glucose, UA neg      Bilirubin, UA neg     Ketones, UA neg     Spec Grav, UA 1.020     Blood, UA neg     pH, UA 6.0     Protein, UA neg     Urobilinogen, UA 0.2     Nitrite, UA neg     Leukocytes, UA Negative         Assessment & Plan:  Justin Waters is a 62 y.o. male Routine general medical examination at a health care facility - Plan: POCT CBC, IFOBT POC (occult bld, rslt in office), POCT urinalysis dipstick, Comprehensive metabolic panel, Lipid panel, TSH (hx of night sweats, but can follow up to discuss further).  Anticipatory guidance as below. utd on td, colonoscopy - repeat planned in 4 years. Check psa.   Cerumen impaction, bilateral, with Allergic rhinitis possible cause of ETD of blocked hearing. trial otc debrox, antihistamine and recheck in next week if not improving.   Low testosterone - Plan: Testosterone, PSA, lipids, cbc, cmp.  Can check tonight, but recheck am level if low - can check lab only visit if needed.   Other and unspecified hyperlipidemia - continue pravachol 40mg  qd. . Labs above.   Epicondylitis, lateral, right - persistent, recurring sx's.  Continue counterforce bracing, HEP by sports med handout, recheck or to [facility] if not improving in next 6 weeks for possible U/S and other tx options.    Need for Zostavax administration - paper rx given.   Meds ordered this encounter  Medications  . pravastatin (PRAVACHOL) 40 MG tablet    Sig: Take 1 tablet (40 mg total) by mouth daily.    Dispense:  90 tablet    Refill:  1    Patient Instructions  Try over the counter Debrox for the wax in your ears, and allegra (or Zyrtec) over the counter each day for allergies.  If not helping your blockage in the ears - recheck in the next week to discuss further.  Continue the tennis elbow brace, see the handout for exercises and recheck in the next 6 weeks to discuss further. You should receive a call or letter about your lab results within the next week to 10 days.  If  testosterone is low, will need repeat level testing in the morning to verify it is truly low before restarting medication. Take the shingles vaccine prescription to your pharmacy.  Follow up to discuss any concerns that were not addressed today.    Keeping you healthy  Get these tests  Blood pressure- Have your blood pressure checked once a year by your healthcare provider.  Normal blood pressure is 120/80  Weight- Have your body mass index (BMI) calculated to screen for obesity.  BMI is a measure of body fat based on height and weight. You can also calculate your own BMI at [email or web address].  Cholesterol- Have your cholesterol checked every year.  Diabetes- Have your blood sugar checked regularly if you have high blood pressure, high cholesterol, have a family history of diabetes or if you are overweight.  Screening for Colon Cancer- Colonoscopy starting at age 27.  Screening may begin sooner depending on your family history and other health conditions. Follow up colonoscopy as directed by your Gastroenterologist.  Screening for Prostate Cancer- Both blood work (PSA) and a rectal exam help screen for Prostate Cancer.  Screening begins at age 87 with African-American men and at age 16 with Caucasian men.  Screening may begin sooner depending on your family history.  Take these medicines  Aspirin- One aspirin daily can help prevent Heart disease and Stroke.  Flu shot- Every fall.  Tetanus- Every 10 years.  Zostavax- Once after the age of 40 to prevent Shingles.  Pneumonia shot- Once after the age of 41; if you are younger than 75, ask your healthcare provider if you need a Pneumonia shot.  Take these steps  Don't smoke- If you do smoke, talk to your doctor about quitting.  For tips on how to quit, go to www.smokefree.gov or call 1-800-QUIT-NOW.  Be physically active- Exercise 5 days a week for at least 30 minutes.  If you are not already physically active start slow and  gradually work up to 30 minutes of moderate physical activity.  Examples of moderate activity include walking briskly, mowing the yard, dancing, swimming, bicycling, etc.  Eat a healthy diet- Eat a variety of healthy food such as fruits, vegetables, low fat milk, low fat cheese, yogurt, lean meant, poultry, fish, beans, tofu, etc. For more information go to www.thenutritionsource.org  Drink alcohol in moderation- Limit alcohol intake to less than two drinks a day. Never drink and drive.  Dentist- Brush and floss twice daily; visit your dentist twice a year.  Depression- Your emotional health is as important as your physical health. If you're feeling down, or losing interest in things you would normally enjoy please talk to your healthcare provider.  Eye exam- Visit your eye doctor every year.  Safe sex- If you may be exposed to a sexually transmitted infection, use a condom.  Seat belts- Seat belts can save your life; always wear one.  Smoke/Carbon Monoxide detectors- These detectors need to be installed on the appropriate level of your home.  Replace batteries at least once a year.  Skin cancer- When out in the sun, cover up and use sunscreen 15 SPF or higher.  Violence- If anyone is threatening you, please tell your healthcare provider.  Living Will/ Health care power of attorney- Speak with your healthcare provider and family.

## 2013-02-17 NOTE — Patient Instructions (Addendum)
Try over the counter Debrox for the wax in your ears, and allegra (or Zyrtec) over the counter each day for allergies.  If not helping your blockage in the ears - recheck in the next week to discuss further.  Continue the tennis elbow brace, see the handout for exercises and recheck in the next 6 weeks to discuss further. You should receive a call or letter about your lab results within the next week to 10 days.  If testosterone is low, will need repeat level testing in the morning to verify it is truly low before restarting medication. Take the shingles vaccine prescription to your pharmacy.  Follow up to discuss any concerns that were not addressed today.    Keeping you healthy  Get these tests  Blood pressure- Have your blood pressure checked once a year by your healthcare provider.  Normal blood pressure is 120/80  Weight- Have your body mass index (BMI) calculated to screen for obesity.  BMI is a measure of body fat based on height and weight. You can also calculate your own BMI at [email or web address].  Cholesterol- Have your cholesterol checked every year.  Diabetes- Have your blood sugar checked regularly if you have high blood pressure, high cholesterol, have a family history of diabetes or if you are overweight.  Screening for Colon Cancer- Colonoscopy starting at age 69.  Screening may begin sooner depending on your family history and other health conditions. Follow up colonoscopy as directed by your Gastroenterologist.  Screening for Prostate Cancer- Both blood work (PSA) and a rectal exam help screen for Prostate Cancer.  Screening begins at age 58 with African-American men and at age 74 with Caucasian men.  Screening may begin sooner depending on your family history.  Take these medicines  Aspirin- One aspirin daily can help prevent Heart disease and Stroke.  Flu shot- Every fall.  Tetanus- Every 10 years.  Zostavax- Once after the age of 77 to prevent  Shingles.  Pneumonia shot- Once after the age of 80; if you are younger than 11, ask your healthcare provider if you need a Pneumonia shot.  Take these steps  Don't smoke- If you do smoke, talk to your doctor about quitting.  For tips on how to quit, go to www.smokefree.gov or call 1-800-QUIT-NOW.  Be physically active- Exercise 5 days a week for at least 30 minutes.  If you are not already physically active start slow and gradually work up to 30 minutes of moderate physical activity.  Examples of moderate activity include walking briskly, mowing the yard, dancing, swimming, bicycling, etc.  Eat a healthy diet- Eat a variety of healthy food such as fruits, vegetables, low fat milk, low fat cheese, yogurt, lean meant, poultry, fish, beans, tofu, etc. For more information go to www.thenutritionsource.org  Drink alcohol in moderation- Limit alcohol intake to less than two drinks a day. Never drink and drive.  Dentist- Brush and floss twice daily; visit your dentist twice a year.  Depression- Your emotional health is as important as your physical health. If you're feeling down, or losing interest in things you would normally enjoy please talk to your healthcare provider.  Eye exam- Visit your eye doctor every year.  Safe sex- If you may be exposed to a sexually transmitted infection, use a condom.  Seat belts- Seat belts can save your life; always wear one.  Smoke/Carbon Monoxide detectors- These detectors need to be installed on the appropriate level of your home.  Replace batteries at least once a  year.  Skin cancer- When out in the sun, cover up and use sunscreen 15 SPF or higher.  Violence- If anyone is threatening you, please tell your healthcare provider.  Living Will/ Health care power of attorney- Speak with your healthcare provider and family.

## 2013-02-18 LAB — LIPID PANEL
Cholesterol: 206 mg/dL — ABNORMAL HIGH (ref 0–200)
HDL: 51 mg/dL
LDL Cholesterol: 135 mg/dL — ABNORMAL HIGH (ref 0–99)
Total CHOL/HDL Ratio: 4 ratio
Triglycerides: 102 mg/dL
VLDL: 20 mg/dL (ref 0–40)

## 2013-02-18 LAB — TSH: TSH: 5.394 u[IU]/mL — ABNORMAL HIGH (ref 0.350–4.500)

## 2013-02-18 LAB — COMPREHENSIVE METABOLIC PANEL
ALT: 24 U/L (ref 0–53)
AST: 27 U/L (ref 0–37)
Albumin: 5.2 g/dL (ref 3.5–5.2)
Alkaline Phosphatase: 75 U/L (ref 39–117)
BUN: 19 mg/dL (ref 6–23)
CO2: 25 mEq/L (ref 19–32)
Calcium: 10.4 mg/dL (ref 8.4–10.5)
Chloride: 103 mEq/L (ref 96–112)
Creat: 0.89 mg/dL (ref 0.50–1.35)
Glucose, Bld: 91 mg/dL (ref 70–99)
Potassium: 4.1 mEq/L (ref 3.5–5.3)
Sodium: 140 mEq/L (ref 135–145)
Total Bilirubin: 0.5 mg/dL (ref 0.3–1.2)
Total Protein: 7.7 g/dL (ref 6.0–8.3)

## 2013-02-18 LAB — PSA: PSA: 2.84 ng/mL

## 2013-02-18 LAB — TESTOSTERONE: Testosterone: 168 ng/dL — ABNORMAL LOW (ref 300–890)

## 2013-03-06 ENCOUNTER — Other Ambulatory Visit: Payer: Self-pay | Admitting: Family Medicine

## 2013-03-06 DIAGNOSIS — R7989 Other specified abnormal findings of blood chemistry: Secondary | ICD-10-CM

## 2013-03-14 ENCOUNTER — Other Ambulatory Visit (INDEPENDENT_AMBULATORY_CARE_PROVIDER_SITE_OTHER): Payer: 59

## 2013-03-14 DIAGNOSIS — R7989 Other specified abnormal findings of blood chemistry: Secondary | ICD-10-CM

## 2013-03-14 DIAGNOSIS — R946 Abnormal results of thyroid function studies: Secondary | ICD-10-CM

## 2013-03-14 DIAGNOSIS — E291 Testicular hypofunction: Secondary | ICD-10-CM

## 2013-03-14 LAB — TESTOSTERONE: Testosterone: 607 ng/dL (ref 300–890)

## 2013-03-14 LAB — TSH: TSH: 1.001 u[IU]/mL (ref 0.350–4.500)

## 2013-04-18 ENCOUNTER — Ambulatory Visit (INDEPENDENT_AMBULATORY_CARE_PROVIDER_SITE_OTHER): Payer: 59 | Admitting: Emergency Medicine

## 2013-04-18 ENCOUNTER — Ambulatory Visit: Payer: 59

## 2013-04-18 VITALS — BP 146/76 | HR 68 | Temp 98.1°F | Resp 16 | Ht 70.5 in | Wt 175.0 lb

## 2013-04-18 DIAGNOSIS — R05 Cough: Secondary | ICD-10-CM

## 2013-04-18 DIAGNOSIS — R059 Cough, unspecified: Secondary | ICD-10-CM

## 2013-04-18 DIAGNOSIS — J209 Acute bronchitis, unspecified: Secondary | ICD-10-CM

## 2013-04-18 NOTE — Patient Instructions (Addendum)

## 2013-04-18 NOTE — Progress Notes (Signed)
[facility] [address] [place] [address] [phone]  Date:  04/18/2013   Name:  MCCLAIN SHALL   DOB:  Aug 07, 1951   MRN:  [id number]  PCP:  No PCP Per Patient    Chief Complaint: chest congestion, Fatigue and Shortness of Breath   History of Present Illness:  EWART CARRERA is a 62 y.o. very pleasant male patient who presents with the following:  Ill for "at least a month".  Has nasal congestion and drainage that is mucoid.  Has pressure and difficulty hearing in left ear and cough and shortness of breath.  Nonproductive.  No wheezing.  Concerned that he may be developing pneumonia.  Poor appetite.  No nausea or vomiting or stool change.  Smokes less than a pack a day.  Feels hot but no fever or chills.  No improvement with over the counter medications or other home remedies. Denies other complaint or health concern today.   Patient Active Problem List   Diagnosis Date Noted  . GERD (gastroesophageal reflux disease)   . Hx of colonic polyps     Past Medical History  Diagnosis Date  . Bipolar 1 disorder     ?? off meds 2012  . History of inguinal hernia repair   . GERD (gastroesophageal reflux disease)   . Hx of colonic polyps   . Arthritis     Past Surgical History  Procedure Laterality Date  . Hernia repair  2007    lap [facility] TEP  . Hernia repair  08/20/2011    lap [facility]  . Vasectomy      History  Substance Use Topics  . Smoking status: Former Games developer  . Smokeless tobacco: Former Neurosurgeon    Quit date: 12/10/2011  . Alcohol Use: Yes     Comment: 2-3 beers daily    Family History  Problem Relation Age of Onset  . Asthma Mother   . COPD Father     No Known Allergies  Medication list has been reviewed and updated.  Current Outpatient Prescriptions on File Prior to Visit  Medication Sig Dispense Refill  . ibuprofen (ADVIL,MOTRIN) 200 MG tablet Take 200 mg by mouth every 6 (six) hours as needed.      . pravastatin (PRAVACHOL) 40 MG  tablet Take 1 tablet (40 mg total) by mouth daily.  90 tablet  1  . Cephalexin 500 MG tablet Take 1 tablet (500 mg total) by mouth 4 (four) times daily.  40 tablet  0  . testosterone (TESTIM) 50 MG/5GM GEL Place 5 g onto the skin daily. Need office visit and labs for additional refills.  150 g  0   No current facility-administered medications on file prior to visit.    Review of Systems:  As per HPI, otherwise negative.    Physical Examination: Filed Vitals:   04/18/13 1458  BP: 146/76  Pulse: 68  Temp: 98.1 F (36.7 C)  Resp: 16   Filed Vitals:   04/18/13 1458  Height: 5' 10.5" (1.791 m)  Weight: 175 lb (79.379 kg)   Body mass index is 24.75 kg/(m^2). Ideal Body Weight: Weight in (lb) to have BMI = 25: 176.4  GEN: WDWN, NAD, Non-toxic, A & O x 3 HEENT: Atraumatic, Normocephalic. Neck supple. No masses, No LAD. Ears and Nose: No external deformity. CV: RRR, No M/G/R. No JVD. No thrill. No extra heart sounds. PULM: CTA B, no wheezes, crackles, rhonchi. No retractions. No resp. distress. No accessory  muscle use. ABD: S, NT, ND, +BS. No rebound. No HSM. EXTR: No c/c/e NEURO Normal gait.  PSYCH: Normally interactive. Conversant. Not depressed or anxious appearing.  Calm demeanor.    Assessment and Plan: Bronchitis mucinex d or dm   Signed,  Phillips Odor, MD   [facility] reading (PRIMARY) by  Dr. Dareen Piano.  Negative chest.

## 2014-04-17 ENCOUNTER — Encounter (HOSPITAL_COMMUNITY): Payer: Self-pay | Admitting: Emergency Medicine

## 2014-04-17 ENCOUNTER — Emergency Department (HOSPITAL_COMMUNITY)
Admission: EM | Admit: 2014-04-17 | Discharge: 2014-04-17 | Disposition: A | Discharge status: Home / Self Care | Payer: 59 | Source: Home / Self Care | Attending: Emergency Medicine | Admitting: Emergency Medicine

## 2014-04-17 DIAGNOSIS — L0390 Cellulitis, unspecified: Secondary | ICD-10-CM

## 2014-04-17 DIAGNOSIS — Z23 Encounter for immunization: Secondary | ICD-10-CM

## 2014-04-17 DIAGNOSIS — L0291 Cutaneous abscess, unspecified: Secondary | ICD-10-CM

## 2014-04-17 MED ORDER — DOXYCYCLINE HYCLATE 100 MG PO TABS: 100.0000 mg | ORAL_TABLET | Freq: Two times a day (BID) | ORAL | Status: DC

## 2014-04-17 MED ORDER — HYDROCODONE-ACETAMINOPHEN 5-325 MG PO TABS
ORAL_TABLET | ORAL | Status: DC
Start: 2014-04-17 — End: 2014-05-11

## 2014-04-17 MED ORDER — TETANUS-DIPHTH-ACELL PERTUSSIS 5-2.5-18.5 LF-MCG/0.5 IM SUSP
INTRAMUSCULAR | Status: AC
  Filled 2014-04-17: qty 0.5

## 2014-04-17 MED ORDER — TETANUS-DIPHTH-ACELL PERTUSSIS 5-2.5-18.5 LF-MCG/0.5 IM SUSP
0.5000 mL | Freq: Once | INTRAMUSCULAR | Status: AC
  Administered 2014-04-17: 0.5 mL via INTRAMUSCULAR

## 2014-04-17 NOTE — ED Notes (Signed)
Pt. Stated, I got a fishing hook in my hand

## 2014-04-17 NOTE — Discharge Instructions (Signed)

## 2014-04-17 NOTE — ED Provider Notes (Signed)
Chief Complaint   Chief Complaint  Patient presents with  . Hand Pain  . Abscess    History of Present Illness   Justin Waters is a 63 year old male who was fishing at [employer] 2 weeks ago, when he stuck a fish hook into the palm of his right hand. This did not go as far as the barb I was able to remove it without any difficulty. Ever since then the puncture wound has become swollen and red and tender to touch. He is able to move all his digits well without any difficulty. There is no red streak running up his arm and no fever or chills. It's not draining any pus.  Review of Systems   Other than as noted above, the patient denies any of the following symptoms: Systemic:  No fevers or chills. Musculoskeletal:  No joint pain or arthritis.  Neurological:  No muscular weakness or paresthesias.  [place]   Past medical history, family history, social history, meds, and allergies were reviewed.     Physical Examination   Vital signs:  BP 143/70  Pulse 68  Temp(Src) 97.9 F (36.6 C) (Oral)  Resp 16  SpO2 97% Gen:  Alert and oriented times 3.  In no distress. Musculoskeletal:  Exam of the hand reveals there is a 1 cm pustule on the palm of the hand with a central puncture wound. There is a 4 x 4 centimeter erythema area around this. It's tender to touch..  Otherwise, all joints had a full a ROM with no swelling, bruising or deformity.  No edema, pulses full. Extremities were warm and pink.  Capillary refill was brisk.  Skin:  Clear, warm and dry.  No rash. Neuro:  Alert and oriented times 3.  Muscle strength was normal.  Sensation was intact to light touch.     Procedure Note:  Verbal informed consent was obtained from the patient.  Risks and benefits were outlined with the patient.  Patient understands and accepts these risks. A time out was called and the name of the procedure, the procedure site, and identity of the patient were confirmed verbally and by wristband.    The  procedure was then performed as follows:  The abscess was prepped with Betadine and alcohol and anesthetized with 4 mL of 2% Xylocaine without epinephrine. A single incision was made into the pustular area, yielding a lot of blood but no pus. This was cultured, a small amount of packing was placed in the wound to keep it open, a pressure dressing was then applied.  The patient tolerated the procedure well without any immediate complications.    Course in Urgent [place]   The following medications were given:  Medications  Tdap (BOOSTRIX) injection 0.5 mL (0.5 mLs Intramuscular Given 04/17/14 1735)   Assessment   The encounter diagnosis was Abscess.  Infected puncture wound of the hand.  Plan  1.  Meds:  The following meds were prescribed:   Discharge Medication List as of 04/17/2014  5:32 PM    START taking these medications   Details  doxycycline (VIBRA-TABS) 100 MG tablet Take 1 tablet (100 mg total) by mouth 2 (two) times daily., Starting 04/17/2014, Until Discontinued, Normal    HYDROcodone-acetaminophen (NORCO/VICODIN) 5-325 MG per tablet 1 to 2 tabs every 4 to 6 hours as needed for pain., Print        2.  Patient Education/Counseling:  The patient was given appropriate handouts, self care instructions, and instructed in symptomatic  relief, including rest and activity, and elevation. Suggest he leave the dressing in place and followup with Dr. Lenon Curt early next week. Dilated the hand and rest in the meantime.  3.  Follow up:  The patient was told to follow up here if no better in 3 to 4 days, or sooner if becoming worse in any way, and given some red flag symptoms such as worsening pain, fever, swelling, or neurological symptoms which would prompt immediate return.        Harden Mo, MD 04/17/14 [phone]

## 2014-04-19 ENCOUNTER — Emergency Department (INDEPENDENT_AMBULATORY_CARE_PROVIDER_SITE_OTHER)
Admission: EM | Admit: 2014-04-19 | Discharge: 2014-04-19 | Disposition: A | Discharge status: Home / Self Care | Payer: 59 | Source: Home / Self Care

## 2014-04-19 ENCOUNTER — Encounter (HOSPITAL_COMMUNITY): Payer: Self-pay | Admitting: Emergency Medicine

## 2014-04-19 DIAGNOSIS — Z4800 Encounter for change or removal of nonsurgical wound dressing: Secondary | ICD-10-CM

## 2014-04-19 DIAGNOSIS — Z5189 Encounter for other specified aftercare: Secondary | ICD-10-CM

## 2014-04-19 DIAGNOSIS — L089 Local infection of the skin and subcutaneous tissue, unspecified: Secondary | ICD-10-CM

## 2014-04-19 NOTE — ED Notes (Signed)
Here for follow up

## 2014-04-19 NOTE — ED Provider Notes (Signed)
Medical screening examination/treatment/procedure(s) were performed by resident physician or non-physician practitioner and as supervising physician I was immediately available for consultation/collaboration.   Pauline Good MD.   Billy Fischer, MD 04/19/14 [phone]

## 2014-04-19 NOTE — ED Provider Notes (Signed)
CSN: [id number]     Arrival date & time 04/19/14  1414 History   First MD Initiated Contact with Patient 04/19/14 1540     Chief Complaint  Patient presents with  . Follow-up   (Consider location/radiation/quality/duration/timing/severity/associated sxs/prior Treatment) HPI Comments: F/u for wound of the right hand. This had been I and D'd 2 d ago with packing to allow to drain. Feeling better per pt.  24h hr report for culture is negative growth.   Past Medical History  Diagnosis Date  . Bipolar 1 disorder     ?? off meds 2012  . History of inguinal hernia repair   . GERD (gastroesophageal reflux disease)   . Hx of colonic polyps   . Arthritis    Past Surgical History  Procedure Laterality Date  . Hernia repair  2007    lap RIH TEP  . Hernia repair  08/20/2011    lap LIH  . Vasectomy     Family History  Problem Relation Age of Onset  . Asthma Mother   . COPD Father    History  Substance Use Topics  . Smoking status: Former Research scientist (life sciences)  . Smokeless tobacco: Former Systems developer    Quit date: 12/10/2011  . Alcohol Use: Yes     Comment: 2-3 beers daily    Review of Systems  All other systems reviewed and are negative.   Allergies  Review of patient's allergies indicates no known allergies.  Home Medications   Prior to Admission medications   Medication Sig Start Date End Date Taking? Authorizing Provider  Cephalexin 500 MG tablet Take 1 tablet (500 mg total) by mouth 4 (four) times daily. 08/23/12   Shawnee Knapp, MD  doxycycline (VIBRA-TABS) 100 MG tablet Take 1 tablet (100 mg total) by mouth 2 (two) times daily. 04/17/14   Harden Mo, MD  HYDROcodone-acetaminophen (NORCO/VICODIN) 5-325 MG per tablet 1 to 2 tabs every 4 to 6 hours as needed for pain. 04/17/14   Harden Mo, MD  ibuprofen (ADVIL,MOTRIN) 200 MG tablet Take 200 mg by mouth every 6 (six) hours as needed.    Historical Provider, MD  loratadine (CLARITIN) 10 MG tablet Take 10 mg by mouth daily.    Historical  Provider, MD  Multiple Vitamin (MULTIVITAMIN) tablet Take 1 tablet by mouth daily.    Historical Provider, MD  pravastatin (PRAVACHOL) 40 MG tablet Take 1 tablet (40 mg total) by mouth daily. 02/17/13 02/17/14  Wendie Agreste, MD  testosterone (TESTIM) 50 MG/5GM GEL Place 5 g onto the skin daily. Need office visit and labs for additional refills. 10/10/12   Chelle S Jeffery, PA-C   BP 155/82  Pulse 73  Temp(Src) 98.2 F (36.8 C) (Oral)  Resp 18  Ht 6' (1.829 m)  Wt 185 lb (83.915 kg)  BMI 25.08 kg/m2  SpO2 96% Physical Exam  Nursing note and vitals reviewed. Constitutional: He appears well-developed. No distress.  Neurological: He is alert. He exhibits normal muscle tone.  Skin: Skin is warm and dry.  Wound is closing by secondary intention. No drainage, bleeding, erythema, fluctuance, tenderness, lymphangitis.   Psychiatric: He has a normal mood and affect.    ED Course  Procedures (including critical care time) Labs Review Labs Reviewed - No data to display  Imaging Review No results found.   MDM   1. Skin infection   2. Visit for wound check    Healing well. No drainage or bleeding. Dry. No current signs of infection, resolving  nicely.  Packing removed. Bandaid.      Janne Napoleon, NP 04/19/14 1606

## 2014-04-21 LAB — CULTURE, ROUTINE-ABSCESS
Culture: NO GROWTH
Gram Stain: NONE SEEN

## 2014-05-11 ENCOUNTER — Ambulatory Visit (INDEPENDENT_AMBULATORY_CARE_PROVIDER_SITE_OTHER): Payer: 59 | Admitting: Physician Assistant

## 2014-05-11 VITALS — BP 138/83 | HR 73 | Temp 98.1°F | Resp 18 | Ht 71.0 in | Wt 194.6 lb

## 2014-05-11 DIAGNOSIS — L821 Other seborrheic keratosis: Secondary | ICD-10-CM

## 2014-05-11 NOTE — Progress Notes (Signed)
   Subjective:    Patient ID: Justin Waters, male    DOB: [birth year], 63 y.o.   MRN: [id number]  HPI 63 year old male presents for evaluation of a "spot" on his back. States he thinks it has been there for 2-3 months, but is not sure. Admits he has noticed that it has gotten slightly larger, although it is hard for him to see due to location.  No pain, drainage, or bleeding.  Denies any hx of skin cancers or mole removal.   Patient is otherwise doing well with no other concerns today.    Review of Systems  Constitutional: Negative for fever and chills.       Objective:   Physical Exam  Constitutional: He is oriented to person, place, and time. He appears well-developed and well-nourished.  HENT:  Head: Normocephalic and atraumatic.  Right Ear: External ear normal.  Left Ear: External ear normal.  Eyes: Conjunctivae are normal.  Neck: Normal range of motion.  Cardiovascular: Normal rate.   Pulmonary/Chest: Effort normal.  Neurological: He is alert and oriented to person, place, and time.  Skin:     Psychiatric: He has a normal mood and affect. His behavior is normal. Judgment and thought content normal.          Assessment & Plan:  Seborrheic keratosis  Reassurance provided.  Discussed shave biopsy if it becomes bothersome or changes RTC prn.

## 2014-05-11 NOTE — Patient Instructions (Signed)
Seborrheic Keratosis Seborrheic keratosis is a common, noncancerous (benign) skin growth that can occur anywhere on the skin.It looks like "stuck-on," waxy, rough, tan, brown, or black spots on the skin. These skin growths can be flat or raised.They are often called "barnacles" because of their pasted-on appearance.Usually, these skin growths appear in adulthood, around age 62, and increase in number as you age. They may also develop during pregnancy or following estrogen therapy. Many people may only have one growth appear in their lifetime, while some people may develop many growths. CAUSES It is unknown what causes these skin growths, but they appear to run in families. SYMPTOMS Seborrheic keratosis is often located on the face, chest, shoulders, back, or other areas. These growths are:  Usually painless, but may become irritated and itchy.  Yellow, brown, black, or other colors.  Slightly raised or have a flat surface.  Sometimes rough or wart-like in texture.  Often waxy on the surface.  Round or oval-shaped.  Sometimes "stuck-on" in appearance.  Sometimes single, but there are usually many growths. Any growth that bleeds, itches on a regular basis, becomes inflamed, or becomes irritated needs to be evaluated by a skin specialist (dermatologist). DIAGNOSIS Diagnosis is mainly based on the way the growths appear. In some cases, it can be difficult to tell this type of skin growth from skin cancer. A skin growth tissue sample (biopsy) may be used to confirm the diagnosis. TREATMENT Most often, treatment is not needed because the skin growths are benign.If the skin growth is irritated easily by clothing or jewelry, causing it to scab or bleed, treatment may be recommended. Patients may also choose to have the growths removed because they do not like their appearance. Most commonly, these growths are treated with cryosurgery. In cryosurgery, liquid nitrogen is applied to "freeze" the  growth. The growth usually falls off within a matter of days. A blister may form and dry into a scab that will also fall off. After the growth or scab falls off, it may leave a dark or light spot on the skin. This color may fade over time, or it may remain permanent on the skin. HOME CARE INSTRUCTIONS If the skin growths are treated with cryosurgery, the treated area needs to be kept clean with water and soap. SEEK MEDICAL CARE IF:  You have questions about these growths or other skin problems.  You develop new symptoms, including:  A change in the appearance of the skin growth.  New growths.  Any bleeding, itching, or pain in the growths.  A skin growth that looks similar to seborrheic keratosis. Document Released: 10/19/2010 Document Revised: 12/09/2011 Document Reviewed: 10/19/2010 ExitCare Patient Information 2015 [employer]. This information is not intended to replace advice given to you by your health care provider. Make sure you discuss any questions you have with your health care provider.  

## 2015-07-10 ENCOUNTER — Other Ambulatory Visit: Payer: Self-pay | Admitting: Family Medicine

## 2015-07-10 ENCOUNTER — Ambulatory Visit
Admission: RE | Admit: 2015-07-10 | Discharge: 2015-07-10 | Disposition: A | Discharge status: Home / Self Care | Payer: 59 | Source: Ambulatory Visit | Attending: Family Medicine | Admitting: Family Medicine

## 2015-07-10 DIAGNOSIS — M25552 Pain in left hip: Secondary | ICD-10-CM

## 2015-12-12 ENCOUNTER — Other Ambulatory Visit: Payer: Self-pay | Admitting: Family Medicine

## 2015-12-12 DIAGNOSIS — R1032 Left lower quadrant pain: Secondary | ICD-10-CM

## 2015-12-20 ENCOUNTER — Ambulatory Visit
Admission: RE | Admit: 2015-12-20 | Discharge: 2015-12-20 | Disposition: A | Discharge status: Home / Self Care | Payer: 59 | Source: Ambulatory Visit | Attending: Family Medicine | Admitting: Family Medicine

## 2015-12-20 DIAGNOSIS — R1032 Left lower quadrant pain: Secondary | ICD-10-CM

## 2015-12-20 MED ORDER — IOPAMIDOL (ISOVUE-300) INJECTION 61%
100.0000 mL | Freq: Once | INTRAVENOUS | Status: AC | PRN
  Administered 2015-12-20: 100 mL via INTRAVENOUS

## 2017-11-10 ENCOUNTER — Other Ambulatory Visit: Payer: Self-pay | Admitting: Dermatology

## 2017-11-10 DIAGNOSIS — D485 Neoplasm of uncertain behavior of skin: Secondary | ICD-10-CM | POA: Diagnosis not present

## 2017-11-10 DIAGNOSIS — L570 Actinic keratosis: Secondary | ICD-10-CM | POA: Diagnosis not present

## 2017-11-10 DIAGNOSIS — D229 Melanocytic nevi, unspecified: Secondary | ICD-10-CM | POA: Diagnosis not present

## 2017-11-10 DIAGNOSIS — L439 Lichen planus, unspecified: Secondary | ICD-10-CM | POA: Diagnosis not present

## 2017-11-10 DIAGNOSIS — L820 Inflamed seborrheic keratosis: Secondary | ICD-10-CM | POA: Diagnosis not present

## 2018-01-21 ENCOUNTER — Ambulatory Visit (INDEPENDENT_AMBULATORY_CARE_PROVIDER_SITE_OTHER): Payer: Medicare Other | Admitting: Podiatry

## 2018-01-21 ENCOUNTER — Encounter: Payer: Self-pay | Admitting: Podiatry

## 2018-01-21 ENCOUNTER — Other Ambulatory Visit: Payer: Self-pay | Admitting: Podiatry

## 2018-01-21 ENCOUNTER — Ambulatory Visit (INDEPENDENT_AMBULATORY_CARE_PROVIDER_SITE_OTHER): Payer: Medicare Other

## 2018-01-21 DIAGNOSIS — M7751 Other enthesopathy of right foot: Secondary | ICD-10-CM

## 2018-01-21 DIAGNOSIS — M25571 Pain in right ankle and joints of right foot: Secondary | ICD-10-CM

## 2018-01-21 DIAGNOSIS — M779 Enthesopathy, unspecified: Secondary | ICD-10-CM

## 2018-01-21 MED ORDER — TRIAMCINOLONE ACETONIDE 10 MG/ML IJ SUSP
10.0000 mg | Freq: Once | INTRAMUSCULAR | Status: AC
  Administered 2018-01-21: 10 mg

## 2018-01-22 NOTE — Progress Notes (Signed)
Subjective:   Patient ID: Justin Waters, male   DOB: 67 y.o.   MRN: [id number]   HPI Patient presents stating he had injury about 4 months ago it irritated his right Achilles is been swollen since on the one side and making it hard to wear shoe gear.  Patient does not smoke likes to be active   Review of Systems  All other systems reviewed and are negative.       Objective:  Physical Exam  Constitutional: He appears well-developed and well-nourished.  Cardiovascular: Intact distal pulses.  Pulmonary/Chest: Effort normal.  Musculoskeletal: Normal range of motion.  Neurological: He is alert.  Skin: Skin is warm.  Nursing note and vitals reviewed.   Neurovascular status intact muscle strength adequate range of motion within normal limits with patient found to have inflammation on the medial side of the right posterior Achilles tendon.  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  Patient wants procedure and I did sterile prep the medial side I injected the medial side of the tendon with 3 mg Kenalog 5 mg Xylocaine and advised on ice therapy reduced activity and will reappoint if symptoms persist over the next month x-ray report indicates that there is minimal spurring no indication of stress fracture or trauma damage to the heel bone itself

## 2018-03-05 DIAGNOSIS — M7731 Calcaneal spur, right foot: Secondary | ICD-10-CM | POA: Diagnosis not present

## 2018-03-05 DIAGNOSIS — M25571 Pain in right ankle and joints of right foot: Secondary | ICD-10-CM | POA: Diagnosis not present

## 2018-03-05 DIAGNOSIS — M7661 Achilles tendinitis, right leg: Secondary | ICD-10-CM | POA: Diagnosis not present

## 2018-03-11 DIAGNOSIS — M25571 Pain in right ankle and joints of right foot: Secondary | ICD-10-CM | POA: Diagnosis not present

## 2018-03-18 DIAGNOSIS — M25571 Pain in right ankle and joints of right foot: Secondary | ICD-10-CM | POA: Diagnosis not present

## 2018-03-18 DIAGNOSIS — M7661 Achilles tendinitis, right leg: Secondary | ICD-10-CM | POA: Diagnosis not present

## 2018-03-18 DIAGNOSIS — R262 Difficulty in walking, not elsewhere classified: Secondary | ICD-10-CM | POA: Diagnosis not present

## 2018-03-18 DIAGNOSIS — M25671 Stiffness of right ankle, not elsewhere classified: Secondary | ICD-10-CM | POA: Diagnosis not present

## 2018-03-24 DIAGNOSIS — M25671 Stiffness of right ankle, not elsewhere classified: Secondary | ICD-10-CM | POA: Diagnosis not present

## 2018-03-24 DIAGNOSIS — R262 Difficulty in walking, not elsewhere classified: Secondary | ICD-10-CM | POA: Diagnosis not present

## 2018-03-24 DIAGNOSIS — M7661 Achilles tendinitis, right leg: Secondary | ICD-10-CM | POA: Diagnosis not present

## 2018-03-24 DIAGNOSIS — M25571 Pain in right ankle and joints of right foot: Secondary | ICD-10-CM | POA: Diagnosis not present

## 2018-03-26 DIAGNOSIS — R262 Difficulty in walking, not elsewhere classified: Secondary | ICD-10-CM | POA: Diagnosis not present

## 2018-03-26 DIAGNOSIS — M7661 Achilles tendinitis, right leg: Secondary | ICD-10-CM | POA: Diagnosis not present

## 2018-03-26 DIAGNOSIS — M25571 Pain in right ankle and joints of right foot: Secondary | ICD-10-CM | POA: Diagnosis not present

## 2018-03-26 DIAGNOSIS — M25671 Stiffness of right ankle, not elsewhere classified: Secondary | ICD-10-CM | POA: Diagnosis not present

## 2018-03-30 DIAGNOSIS — M25671 Stiffness of right ankle, not elsewhere classified: Secondary | ICD-10-CM | POA: Diagnosis not present

## 2018-03-30 DIAGNOSIS — R262 Difficulty in walking, not elsewhere classified: Secondary | ICD-10-CM | POA: Diagnosis not present

## 2018-03-30 DIAGNOSIS — M7661 Achilles tendinitis, right leg: Secondary | ICD-10-CM | POA: Diagnosis not present

## 2018-03-30 DIAGNOSIS — M25571 Pain in right ankle and joints of right foot: Secondary | ICD-10-CM | POA: Diagnosis not present

## 2018-04-01 DIAGNOSIS — M25571 Pain in right ankle and joints of right foot: Secondary | ICD-10-CM | POA: Diagnosis not present

## 2018-04-01 DIAGNOSIS — R262 Difficulty in walking, not elsewhere classified: Secondary | ICD-10-CM | POA: Diagnosis not present

## 2018-04-01 DIAGNOSIS — M25671 Stiffness of right ankle, not elsewhere classified: Secondary | ICD-10-CM | POA: Diagnosis not present

## 2018-04-01 DIAGNOSIS — M7661 Achilles tendinitis, right leg: Secondary | ICD-10-CM | POA: Diagnosis not present

## 2018-04-07 DIAGNOSIS — M25671 Stiffness of right ankle, not elsewhere classified: Secondary | ICD-10-CM | POA: Diagnosis not present

## 2018-04-07 DIAGNOSIS — M25571 Pain in right ankle and joints of right foot: Secondary | ICD-10-CM | POA: Diagnosis not present

## 2018-04-07 DIAGNOSIS — M7661 Achilles tendinitis, right leg: Secondary | ICD-10-CM | POA: Diagnosis not present

## 2018-04-07 DIAGNOSIS — R262 Difficulty in walking, not elsewhere classified: Secondary | ICD-10-CM | POA: Diagnosis not present

## 2018-04-09 DIAGNOSIS — M25671 Stiffness of right ankle, not elsewhere classified: Secondary | ICD-10-CM | POA: Diagnosis not present

## 2018-04-09 DIAGNOSIS — M25571 Pain in right ankle and joints of right foot: Secondary | ICD-10-CM | POA: Diagnosis not present

## 2018-04-09 DIAGNOSIS — M7661 Achilles tendinitis, right leg: Secondary | ICD-10-CM | POA: Diagnosis not present

## 2018-04-09 DIAGNOSIS — R262 Difficulty in walking, not elsewhere classified: Secondary | ICD-10-CM | POA: Diagnosis not present

## 2018-04-13 DIAGNOSIS — M7661 Achilles tendinitis, right leg: Secondary | ICD-10-CM | POA: Diagnosis not present

## 2018-04-13 DIAGNOSIS — M25671 Stiffness of right ankle, not elsewhere classified: Secondary | ICD-10-CM | POA: Diagnosis not present

## 2018-04-13 DIAGNOSIS — R262 Difficulty in walking, not elsewhere classified: Secondary | ICD-10-CM | POA: Diagnosis not present

## 2018-04-13 DIAGNOSIS — M25571 Pain in right ankle and joints of right foot: Secondary | ICD-10-CM | POA: Diagnosis not present

## 2018-04-15 DIAGNOSIS — M7661 Achilles tendinitis, right leg: Secondary | ICD-10-CM | POA: Diagnosis not present

## 2018-04-15 DIAGNOSIS — M7731 Calcaneal spur, right foot: Secondary | ICD-10-CM | POA: Diagnosis not present

## 2018-04-15 DIAGNOSIS — M25571 Pain in right ankle and joints of right foot: Secondary | ICD-10-CM | POA: Diagnosis not present

## 2018-04-15 DIAGNOSIS — R262 Difficulty in walking, not elsewhere classified: Secondary | ICD-10-CM | POA: Diagnosis not present

## 2018-04-28 DIAGNOSIS — R194 Change in bowel habit: Secondary | ICD-10-CM | POA: Diagnosis not present

## 2018-04-28 DIAGNOSIS — R140 Abdominal distension (gaseous): Secondary | ICD-10-CM | POA: Diagnosis not present

## 2018-04-28 DIAGNOSIS — R197 Diarrhea, unspecified: Secondary | ICD-10-CM | POA: Diagnosis not present

## 2018-09-03 DIAGNOSIS — Z23 Encounter for immunization: Secondary | ICD-10-CM | POA: Diagnosis not present

## 2018-11-24 DIAGNOSIS — Z1159 Encounter for screening for other viral diseases: Secondary | ICD-10-CM | POA: Diagnosis not present

## 2018-11-24 DIAGNOSIS — K641 Second degree hemorrhoids: Secondary | ICD-10-CM | POA: Diagnosis not present

## 2018-11-24 DIAGNOSIS — Z0000 Encounter for general adult medical examination without abnormal findings: Secondary | ICD-10-CM | POA: Diagnosis not present

## 2018-11-24 DIAGNOSIS — Z23 Encounter for immunization: Secondary | ICD-10-CM | POA: Diagnosis not present

## 2018-11-24 DIAGNOSIS — R030 Elevated blood-pressure reading, without diagnosis of hypertension: Secondary | ICD-10-CM | POA: Diagnosis not present

## 2018-11-24 DIAGNOSIS — E7800 Pure hypercholesterolemia, unspecified: Secondary | ICD-10-CM | POA: Diagnosis not present

## 2018-11-24 DIAGNOSIS — Z125 Encounter for screening for malignant neoplasm of prostate: Secondary | ICD-10-CM | POA: Diagnosis not present

## 2019-06-14 DIAGNOSIS — Z23 Encounter for immunization: Secondary | ICD-10-CM | POA: Diagnosis not present

## 2019-11-25 ENCOUNTER — Ambulatory Visit: Payer: Medicare Other | Attending: Internal Medicine

## 2019-11-25 DIAGNOSIS — Z23 Encounter for immunization: Secondary | ICD-10-CM | POA: Insufficient documentation

## 2019-11-25 NOTE — Progress Notes (Signed)
   Covid-19 Vaccination Clinic  Name:  Justin Waters    MRN: [id number] DOB: Jan 29, 1951  11/25/2019  Mr. Justin Waters was observed post Covid-19 immunization for 15 minutes without incidence. He was provided with Vaccine Information Sheet and instruction to access the V-Safe system.   Mr. Justin Waters was instructed to call 911 with any severe reactions post vaccine: Marland Kitchen Difficulty breathing  . Swelling of your face and throat  . A fast heartbeat  . A bad rash all over your body  . Dizziness and weakness    Immunizations Administered    Name Date Dose VIS Date Route   Pfizer COVID-19 Vaccine 11/25/2019  1:38 PM 0.3 mL 09/10/2019 Intramuscular   Manufacturer: [place]   Lot: [id number]   [place]: [id number]

## 2019-12-02 DIAGNOSIS — Z0000 Encounter for general adult medical examination without abnormal findings: Secondary | ICD-10-CM | POA: Diagnosis not present

## 2019-12-02 DIAGNOSIS — Z125 Encounter for screening for malignant neoplasm of prostate: Secondary | ICD-10-CM | POA: Diagnosis not present

## 2019-12-02 DIAGNOSIS — E7800 Pure hypercholesterolemia, unspecified: Secondary | ICD-10-CM | POA: Diagnosis not present

## 2019-12-21 ENCOUNTER — Ambulatory Visit: Payer: Medicare Other | Attending: Internal Medicine

## 2019-12-21 DIAGNOSIS — Z23 Encounter for immunization: Secondary | ICD-10-CM

## 2019-12-21 NOTE — Progress Notes (Signed)
   Covid-19 Vaccination Clinic  Name:  Justin Waters    MRN: [id number] DOB: 03/23/1951  12/21/2019  [name] was observed post Covid-19 immunization for 15 minutes without incident. He was provided with Vaccine Information Sheet and instruction to access the V-Safe system.   [name] was instructed to call 911 with any severe reactions post vaccine: Marland Kitchen Difficulty breathing  . Swelling of face and throat  . A fast heartbeat  . A bad rash all over body  . Dizziness and weakness   Immunizations Administered    Name Date Dose VIS Date Route   Pfizer COVID-19 Vaccine 12/21/2019  1:06 PM 0.3 mL 09/10/2019 Intramuscular   Manufacturer: [place]   Lot: [id number]   [place]: [id number]

## 2020-01-05 DIAGNOSIS — Z125 Encounter for screening for malignant neoplasm of prostate: Secondary | ICD-10-CM | POA: Diagnosis not present

## 2020-01-05 DIAGNOSIS — E7800 Pure hypercholesterolemia, unspecified: Secondary | ICD-10-CM | POA: Diagnosis not present

## 2020-01-06 DIAGNOSIS — Z0120 Encounter for dental examination and cleaning without abnormal findings: Secondary | ICD-10-CM | POA: Diagnosis not present

## 2020-08-08 DIAGNOSIS — K4091 Unilateral inguinal hernia, without obstruction or gangrene, recurrent: Secondary | ICD-10-CM | POA: Diagnosis not present

## 2020-08-21 DIAGNOSIS — J310 Chronic rhinitis: Secondary | ICD-10-CM | POA: Diagnosis not present

## 2020-08-21 DIAGNOSIS — J0141 Acute recurrent pansinusitis: Secondary | ICD-10-CM | POA: Diagnosis not present

## 2020-08-21 DIAGNOSIS — J343 Hypertrophy of nasal turbinates: Secondary | ICD-10-CM | POA: Diagnosis not present

## 2020-08-21 DIAGNOSIS — J342 Deviated nasal septum: Secondary | ICD-10-CM | POA: Diagnosis not present

## 2020-09-07 DIAGNOSIS — Z1211 Encounter for screening for malignant neoplasm of colon: Secondary | ICD-10-CM | POA: Diagnosis not present

## 2020-09-07 DIAGNOSIS — Z86010 Personal history of colonic polyps: Secondary | ICD-10-CM | POA: Diagnosis not present

## 2020-09-07 DIAGNOSIS — K219 Gastro-esophageal reflux disease without esophagitis: Secondary | ICD-10-CM | POA: Diagnosis not present

## 2020-09-14 DIAGNOSIS — R0609 Other forms of dyspnea: Secondary | ICD-10-CM | POA: Diagnosis not present

## 2020-09-14 DIAGNOSIS — I10 Essential (primary) hypertension: Secondary | ICD-10-CM | POA: Diagnosis not present

## 2020-09-17 ENCOUNTER — Emergency Department (HOSPITAL_BASED_OUTPATIENT_CLINIC_OR_DEPARTMENT_OTHER): Payer: Medicare Other

## 2020-09-17 ENCOUNTER — Other Ambulatory Visit: Payer: Self-pay

## 2020-09-17 ENCOUNTER — Observation Stay (HOSPITAL_BASED_OUTPATIENT_CLINIC_OR_DEPARTMENT_OTHER)
Admission: EM | Admit: 2020-09-17 | Discharge: 2020-09-20 | Disposition: A | Discharge status: Home / Self Care | Payer: Medicare Other | Attending: Internal Medicine | Admitting: Internal Medicine

## 2020-09-17 ENCOUNTER — Encounter (HOSPITAL_BASED_OUTPATIENT_CLINIC_OR_DEPARTMENT_OTHER): Payer: Self-pay | Admitting: Emergency Medicine

## 2020-09-17 DIAGNOSIS — Z20822 Contact with and (suspected) exposure to covid-19: Secondary | ICD-10-CM | POA: Insufficient documentation

## 2020-09-17 DIAGNOSIS — Z87891 Personal history of nicotine dependence: Secondary | ICD-10-CM | POA: Insufficient documentation

## 2020-09-17 DIAGNOSIS — I160 Hypertensive urgency: Secondary | ICD-10-CM | POA: Diagnosis not present

## 2020-09-17 DIAGNOSIS — R079 Chest pain, unspecified: Principal | ICD-10-CM | POA: Diagnosis present

## 2020-09-17 DIAGNOSIS — R7303 Prediabetes: Secondary | ICD-10-CM | POA: Diagnosis not present

## 2020-09-17 DIAGNOSIS — R42 Dizziness and giddiness: Secondary | ICD-10-CM | POA: Diagnosis not present

## 2020-09-17 DIAGNOSIS — I2510 Atherosclerotic heart disease of native coronary artery without angina pectoris: Secondary | ICD-10-CM | POA: Insufficient documentation

## 2020-09-17 DIAGNOSIS — E119 Type 2 diabetes mellitus without complications: Secondary | ICD-10-CM

## 2020-09-17 DIAGNOSIS — Z86010 Personal history of colonic polyps: Secondary | ICD-10-CM | POA: Diagnosis not present

## 2020-09-17 DIAGNOSIS — K219 Gastro-esophageal reflux disease without esophagitis: Secondary | ICD-10-CM | POA: Diagnosis present

## 2020-09-17 DIAGNOSIS — R0602 Shortness of breath: Secondary | ICD-10-CM | POA: Diagnosis not present

## 2020-09-17 DIAGNOSIS — I10 Essential (primary) hypertension: Secondary | ICD-10-CM

## 2020-09-17 HISTORY — DX: Essential (primary) hypertension: I10

## 2020-09-17 LAB — RESP PANEL BY RT-PCR (FLU A&B, COVID) ARPGX2
Influenza A by PCR: NEGATIVE
Influenza B by PCR: NEGATIVE
SARS Coronavirus 2 by RT PCR: NEGATIVE

## 2020-09-17 LAB — D-DIMER, QUANTITATIVE (NOT AT ARMC): D-Dimer, Quant: 0.42 ug/mL-FEU (ref 0.00–0.50)

## 2020-09-17 LAB — CBC
HCT: 49.4 % (ref 39.0–52.0)
Hemoglobin: 16.6 g/dL (ref 13.0–17.0)
MCH: 29.6 pg (ref 26.0–34.0)
MCHC: 33.6 g/dL (ref 30.0–36.0)
MCV: 88.1 fL (ref 80.0–100.0)
Platelets: 304 10*3/uL (ref 150–400)
RBC: 5.61 MIL/uL (ref 4.22–5.81)
RDW: 12.3 % (ref 11.5–15.5)
WBC: 12.2 10*3/uL — ABNORMAL HIGH (ref 4.0–10.5)
nRBC: 0 % (ref 0.0–0.2)

## 2020-09-17 LAB — BASIC METABOLIC PANEL
Anion gap: 14 (ref 5–15)
BUN: 17 mg/dL (ref 8–23)
CO2: 21 mmol/L — ABNORMAL LOW (ref 22–32)
Calcium: 9.8 mg/dL (ref 8.9–10.3)
Chloride: 102 mmol/L (ref 98–111)
Creatinine, Ser: 1.17 mg/dL (ref 0.61–1.24)
GFR, Estimated: >60 mL/min (ref >60)
Glucose, Bld: 121 mg/dL — ABNORMAL HIGH (ref 70–99)
Potassium: 3.7 mmol/L (ref 3.5–5.1)
Sodium: 137 mmol/L (ref 135–145)

## 2020-09-17 LAB — TROPONIN I (HIGH SENSITIVITY)
Troponin I (High Sensitivity): 9 ng/L (ref <18)
Troponin I (High Sensitivity): 14 ng/L (ref <18)

## 2020-09-17 MED ORDER — NITROGLYCERIN 0.4 MG SL SUBL
0.4000 mg | SUBLINGUAL_TABLET | SUBLINGUAL | Status: DC | PRN
  Administered 2020-09-17: 0.4 mg via SUBLINGUAL
  Filled 2020-09-17: qty 1

## 2020-09-17 MED ORDER — ASPIRIN 81 MG PO CHEW
324.0000 mg | CHEWABLE_TABLET | Freq: Once | ORAL | Status: AC
  Administered 2020-09-17: 324 mg via ORAL
  Filled 2020-09-17: qty 4

## 2020-09-17 NOTE — ED Triage Notes (Signed)
Reports left sided chest pain that started today.  Reports hx of htn.  Waiting to get in with a cardiologist.

## 2020-09-17 NOTE — ED Provider Notes (Signed)
[facility] Provider Note   CSN: [id number] Arrival date & time: 09/17/20  1732     History Chief Complaint  Patient presents with  . Chest Pain    Justin Waters is a 69 y.o. male.  Patient is a 69 year old male with a history of bipolar disorder, GERD tension and hyperlipidemia who presents with chest pain.  He states that he started having chest pain earlier today around noon.  Is been waxing and waning since that time but has been more frequent recently over the last couple hours.  He says it is a little bit better now but still has a little bit of pain.  He describes it as a pressure to his left chest.  There is no radiation.  He does report some shortness of breath with it no nausea or vomiting.  No diaphoresis.  No history of known heart problems in the past.  He has been having some shortness of breath for the last few months and has been referred by her his PCP to a cardiologist but cannot get an appointment till the end of January.  He has no cough or cold symptoms.  He says the pain is a little bit worse when he takes a deep breath but not exertional.  It started when he was watching TV.  He denies any leg pain or swelling.  No recent immobilization.  No history of DVTs.  He is non-smoker.  He does not know if he has a family history of heart disease.        Past Medical History:  Diagnosis Date  . Arthritis   . Bipolar 1 disorder ([place])    ?? off meds 2012  . GERD (gastroesophageal reflux disease)   . History of inguinal hernia repair   . Hx of colonic polyps   . Hypertension     Patient Active Problem List   Diagnosis Date Noted  . Chest pain 09/17/2020  . GERD (gastroesophageal reflux disease)   . Hx of colonic polyps     Past Surgical History:  Procedure Laterality Date  . HERNIA REPAIR  2007   lap RIH TEP  . HERNIA REPAIR  08/20/2011   lap LIH  . VASECTOMY         Family History  Problem Relation Age of Onset  .  Asthma Mother   . COPD Father     Social History   Tobacco Use  . Smoking status: Former Research scientist (life sciences)  . Smokeless tobacco: Former Systems developer    Quit date: 12/10/2011  Substance Use Topics  . Alcohol use: Yes    Comment: 2-3 beers daily  . Drug use: No    Home Medications Prior to Admission medications   Medication Sig Start Date End Date Taking? Authorizing Provider  ibuprofen (ADVIL,MOTRIN) 200 MG tablet Take 200 mg by mouth every 6 (six) hours as needed.    [provider]    Allergies    Patient has no known allergies.  Review of Systems   Review of Systems  Constitutional: Negative for chills, diaphoresis, fatigue and fever.  HENT: Negative for congestion, rhinorrhea and sneezing.   Eyes: Negative.   Respiratory: Positive for shortness of breath. Negative for cough and chest tightness.   Cardiovascular: Positive for chest pain. Negative for leg swelling.  Gastrointestinal: Negative for abdominal pain, blood in stool, diarrhea, nausea and vomiting.  Genitourinary: Negative for difficulty urinating, flank pain, frequency and hematuria.  Musculoskeletal: Negative for arthralgias and  back pain.  Skin: Negative for rash.  Neurological: Negative for dizziness, speech difficulty, weakness, numbness and headaches.    Physical Exam Updated Vital Signs BP (!) 181/109 (BP Location: Left Arm)   Pulse 90   Temp 98.2 F (36.8 C) (Oral)   Resp 19   Ht 5\' 11"  (1.803 m)   Wt 97.5 kg   SpO2 97%   BMI 29.99 kg/m   Physical Exam Constitutional:      Appearance: He is well-developed and well-nourished.  HENT:     Head: Normocephalic and atraumatic.  Eyes:     Pupils: Pupils are equal, round, and reactive to light.  Cardiovascular:     Rate and Rhythm: Normal rate and regular rhythm.     Heart sounds: Normal heart sounds.  Pulmonary:     Effort: Pulmonary effort is normal. No respiratory distress.     Breath sounds: Normal breath sounds. No wheezing or rales.  Chest:      Chest wall: No tenderness.  Abdominal:     General: Bowel sounds are normal.     Palpations: Abdomen is soft.     Tenderness: There is no abdominal tenderness. There is no guarding or rebound.  Musculoskeletal:        General: No edema. Normal range of motion.     Cervical back: Normal range of motion and neck supple.     Comments: No edema or calf tenderness  Lymphadenopathy:     Cervical: No cervical adenopathy.  Skin:    General: Skin is warm and dry.     Findings: No rash.  Neurological:     Mental Status: He is alert and oriented to person, place, and time.  Psychiatric:        Mood and Affect: Mood and affect normal.     ED Results / Procedures / Treatments   Labs (all labs ordered are listed, but only abnormal results are displayed) Labs Reviewed  BASIC METABOLIC PANEL - Abnormal; Notable for the following components:      Result Value   CO2 21 (*)    Glucose, Bld 121 (*)    All other components within normal limits  CBC - Abnormal; Notable for the following components:   WBC 12.2 (*)    All other components within normal limits  RESP PANEL BY RT-PCR (FLU A&B, COVID) ARPGX2  D-DIMER, QUANTITATIVE (NOT AT [facility])  TROPONIN I (HIGH SENSITIVITY)  TROPONIN I (HIGH SENSITIVITY)    EKG EKG Interpretation  Date/Time:  Sunday September 17 2020 20:27:23 EST Ventricular Rate:  87 PR Interval:  176 QRS Duration: 83 QT Interval:  367 QTC Calculation: 442 R Axis:   52 Text Interpretation: Sinus rhythm since last tracing no significant change Confirmed by Malvin Johns [phone]) on 09/17/2020 9:20:56 PM   Radiology DG Chest 2 View  Result Date: 09/17/2020 CLINICAL DATA:  Left-sided chest pain, short of breath, dizziness EXAM: CHEST - 2 VIEW COMPARISON:  06/20/2017 FINDINGS: Frontal and lateral views of the chest demonstrate an unremarkable cardiac silhouette. No airspace disease, effusion, or pneumothorax. No acute bony abnormalities. IMPRESSION: 1. No acute intrathoracic  process. Electronically Signed   By: Randa Ngo M.D.   On: 09/17/2020 20:49    Procedures Procedures (including critical care time)  Medications Ordered in ED Medications  nitroGLYCERIN (NITROSTAT) SL tablet 0.4 mg (0.4 mg Sublingual Given 09/17/20 2129)  aspirin chewable tablet 324 mg (324 mg Oral Given 09/17/20 2128)    ED Course  I have reviewed  the triage vital signs and the nursing notes.  Pertinent labs & imaging results that were available during my care of the patient were reviewed by me and considered in my medical decision making (see chart for details).    MDM Rules/Calculators/A&P                          Patient is a 69 year old male who presents with chest pain.  It is nonexertional but associated with shortness of breath.  His EKG does not show any ischemic changes.  Chest x-ray is clear without evidence of pneumonia or pneumothorax.  His D-dimer is normal and he does not have any other suggestions of PE.  He does have an elevated heart score of 4.  His pain has been relieved with aspirin and nitroglycerin.  He still has an elevated blood pressure of 181/109.  He is currently chest pain-free.  However given his elevated heart score, admission was recommended.  He does not have symptoms that sound more concerning for aortic dissection.  I spoke with Dr. Hal Hope who will admit the patient for further treatment. Final Clinical Impression(s) / ED Diagnoses Final diagnoses:  Chest pain, unspecified type  Hypertension, unspecified type    Rx / DC Orders ED Discharge Orders    None       Malvin Johns, MD 09/17/20 2201

## 2020-09-18 ENCOUNTER — Observation Stay (HOSPITAL_BASED_OUTPATIENT_CLINIC_OR_DEPARTMENT_OTHER): Payer: Medicare Other

## 2020-09-18 ENCOUNTER — Encounter (HOSPITAL_COMMUNITY): Payer: Self-pay | Admitting: Internal Medicine

## 2020-09-18 DIAGNOSIS — I160 Hypertensive urgency: Secondary | ICD-10-CM | POA: Diagnosis not present

## 2020-09-18 DIAGNOSIS — E785 Hyperlipidemia, unspecified: Secondary | ICD-10-CM | POA: Diagnosis not present

## 2020-09-18 DIAGNOSIS — I10 Essential (primary) hypertension: Secondary | ICD-10-CM | POA: Diagnosis not present

## 2020-09-18 DIAGNOSIS — R079 Chest pain, unspecified: Secondary | ICD-10-CM | POA: Diagnosis not present

## 2020-09-18 DIAGNOSIS — R9431 Abnormal electrocardiogram [ECG] [EKG]: Secondary | ICD-10-CM

## 2020-09-18 LAB — TROPONIN I (HIGH SENSITIVITY)
Troponin I (High Sensitivity): 15 ng/L (ref <18)
Troponin I (High Sensitivity): 15 ng/L (ref <18)

## 2020-09-18 LAB — ECHOCARDIOGRAM COMPLETE
Area-P 1/2: 2.5 cm2
Height: 71 in
S' Lateral: 2 cm
Weight: 3400.38 oz

## 2020-09-18 MED ORDER — ENOXAPARIN SODIUM 40 MG/0.4ML ~~LOC~~ SOLN
40.0000 mg | SUBCUTANEOUS | Status: DC
  Administered 2020-09-18 – 2020-09-19 (×2): 40 mg via SUBCUTANEOUS
  Filled 2020-09-18 (×2): qty 0.4

## 2020-09-18 MED ORDER — ACETAMINOPHEN 650 MG RE SUPP: 650.0000 mg | Freq: Four times a day (QID) | RECTAL | Status: DC | PRN

## 2020-09-18 MED ORDER — PERFLUTREN LIPID MICROSPHERE
1.0000 mL | INTRAVENOUS | Status: AC | PRN
  Administered 2020-09-18: 3 mL via INTRAVENOUS
  Filled 2020-09-18: qty 10

## 2020-09-18 MED ORDER — ASPIRIN EC 81 MG PO TBEC
81.0000 mg | DELAYED_RELEASE_TABLET | Freq: Every day | ORAL | Status: DC
  Administered 2020-09-18 – 2020-09-20 (×3): 81 mg via ORAL
  Filled 2020-09-18 (×3): qty 1

## 2020-09-18 MED ORDER — SODIUM CHLORIDE 0.9% FLUSH
3.0000 mL | Freq: Two times a day (BID) | INTRAVENOUS | Status: DC
  Administered 2020-09-18 – 2020-09-20 (×4): 3 mL via INTRAVENOUS

## 2020-09-18 MED ORDER — IRBESARTAN 300 MG PO TABS
150.0000 mg | ORAL_TABLET | Freq: Every day | ORAL | Status: DC
  Administered 2020-09-18 – 2020-09-20 (×3): 150 mg via ORAL
  Filled 2020-09-18 (×3): qty 1

## 2020-09-18 MED ORDER — ONDANSETRON HCL 4 MG PO TABS: 4.0000 mg | ORAL_TABLET | Freq: Four times a day (QID) | ORAL | Status: DC | PRN

## 2020-09-18 MED ORDER — METOPROLOL TARTRATE 100 MG PO TABS
100.0000 mg | ORAL_TABLET | Freq: Once | ORAL | Status: AC
  Administered 2020-09-18: 100 mg via ORAL
  Filled 2020-09-18: qty 1

## 2020-09-18 MED ORDER — ACETAMINOPHEN 325 MG PO TABS: 650.0000 mg | ORAL_TABLET | Freq: Four times a day (QID) | ORAL | Status: DC | PRN

## 2020-09-18 MED ORDER — CARVEDILOL 6.25 MG PO TABS
6.2500 mg | ORAL_TABLET | Freq: Two times a day (BID) | ORAL | Status: DC
  Administered 2020-09-18 – 2020-09-19 (×2): 6.25 mg via ORAL
  Filled 2020-09-18 (×2): qty 1

## 2020-09-18 MED ORDER — SODIUM CHLORIDE 0.9% FLUSH: 3.0000 mL | INTRAVENOUS | Status: DC | PRN

## 2020-09-18 MED ORDER — ONDANSETRON HCL 4 MG/2ML IJ SOLN: 4.0000 mg | Freq: Four times a day (QID) | INTRAMUSCULAR | Status: DC | PRN

## 2020-09-18 MED ORDER — SODIUM CHLORIDE 0.9 % IV SOLN: 250.0000 mL | INTRAVENOUS | Status: DC | PRN

## 2020-09-18 NOTE — ED Notes (Signed)
Pt continues to deny changes in pain, or other sx.

## 2020-09-18 NOTE — Progress Notes (Signed)
  Echocardiogram 2D Echocardiogram has been performed.  Justin Waters 09/18/2020, 4:54 PM

## 2020-09-18 NOTE — Consult Note (Signed)
Cardiology Consultation:   Patient ID: MILFERD ANSELL MRN: [id number]; DOB: 1950/11/09  Admit date: 09/17/2020 Date of Consult: 09/18/2020  Primary Care Provider: Patient, No Pcp Per [place] Cardiologist: No primary care provider on file. Dr. Margaretann [facility] Electrophysiologist:  None    Patient Profile:   KIARA KEEP is a 69 y.o. male with a hx of HTN, prediabetes, hyperlipidemia, 30 pk year smoking history who is being seen today for the evaluation of chest pain at the request of Dr. Cruzita Lederer.  History of Present Illness:   Mr. Fees is a 69 yo male with the above mentioned PMHx who presents with chest pain and severely elevated BP. Chest pressure started on Sunday at rest. He describes substernal and left sided chest pain that was moderate intensity, with some improvement with change in position and worsened by deep breathing. Troponin neg x 3, ECG with no ischemic changes.   Recently started on irbesartan for HTN which he has been taking for several days prior to admission. He was noted to be severely hypertensive 181/109 on presentation to the ER.   He describes a history of HLD intermittently on treatment and prediabetes.   He does not know his family history.   The patient denies palpitations, PND, orthopnea, or leg swelling. Denies cough, fever, chills. Denies nausea, vomiting. Denies syncope or presyncope. Denies dizziness or lightheadedness.   Past Medical History:  Diagnosis Date  . Arthritis   . Bipolar 1 disorder ([place])    ?? off meds 2012  . GERD (gastroesophageal reflux disease)   . History of inguinal hernia repair   . Hx of colonic polyps   . Hypertension     Past Surgical History:  Procedure Laterality Date  . HERNIA REPAIR  2007   lap RIH TEP  . HERNIA REPAIR  08/20/2011   lap LIH  . VASECTOMY       Home Medications:  Prior to Admission medications   Medication Sig Start Date End Date Taking? Authorizing Provider   chlorpheniramine (CHLOR-TRIMETON) 4 MG tablet Take 4 mg by mouth every 4 (four) hours as needed for allergies.   Yes [provider]  ibuprofen (ADVIL,MOTRIN) 200 MG tablet Take 200 mg by mouth every 6 (six) hours as needed.   Yes [provider]  irbesartan (AVAPRO) 150 MG tablet Take 150 mg by mouth daily. 09/14/20  Yes [provider]  Multiple Vitamin (MULTIVITAMIN) capsule Take 1 capsule by mouth daily.   Yes [provider]  Probiotic Product (PROBIOTIC-10 PO) Take 1 tablet by mouth daily.   Yes [provider]    Inpatient Medications: Scheduled Meds: . aspirin EC  81 mg Oral Daily  . carvedilol  6.25 mg Oral BID WC  . enoxaparin (LOVENOX) injection  40 mg Subcutaneous Q24H  . irbesartan  150 mg Oral Daily  . sodium chloride flush  3 mL Intravenous Q12H   Continuous Infusions: . sodium chloride     PRN Meds: sodium chloride, acetaminophen **OR** acetaminophen, nitroGLYCERIN, ondansetron **OR** ondansetron (ZOFRAN) IV, sodium chloride flush  Allergies:   No Known Allergies  Social History:   Social History   Socioeconomic History  . Marital status: Married    Spouse name: Not on file  . Number of children: Not on file  . Years of education: Not on file  . Highest education level: Not on file  Occupational History  . Not on file  Tobacco Use  . Smoking status: Former Research scientist (life sciences)  .  Smokeless tobacco: Former Systems developer    Quit date: 12/10/2011  Vaping Use  . Vaping Use: Never used  Substance and Sexual Activity  . Alcohol use: Yes    Comment: 2-3 beers daily  . Drug use: No  . Sexual activity: Not on file  Other Topics Concern  . Not on file  Social History Narrative  . Not on file   Social Determinants of Health   Financial Resource Strain: Not on file  Food Insecurity: Not on file  Transportation Needs: Not on file  Physical Activity: Not on file  Stress: Not on file  Social Connections: Not on file  Intimate Partner  Violence: Not on file    Family History:    Family History  Problem Relation Age of Onset  . Asthma Mother   . COPD Father      ROS:  Please see the history of present illness.   All other ROS reviewed and negative.     Physical Exam/Data:   Vitals:   09/18/20 0704 09/18/20 1000 09/18/20 1130 09/18/20 1238  BP: (!) 193/93 (!) 161/95 (!) 157/90 (!) 173/94  Pulse: 76 100 95 99  Resp: (!) _0 Temp:    98.6 F (37 C)  TempSrc:    Oral  SpO2: 96% 96% 96% 94%  Weight:    96.4 kg  Height:    _1  (1.803 m)    Intake/Output Summary (Last 24 hours) at 09/18/2020 1856 Last data filed at 09/18/2020 1247 Gross per 24 hour  Intake 222 ml  Output --  Net 222 ml   Last 3 Weights 09/18/2020 09/17/2020 05/11/2014  Weight (lbs) 212 lb 8.4 oz 215 lb 194 lb 9.6 oz  Weight (kg) 96.4 kg 97.523 kg 88.27 kg     Body mass index is 29.64 kg/m.  General:  Well nourished, well developed, in no acute distress HEENT: normal Neck: no JVD Cardiac:  normal S1, S2; RRR; no murmur  Lungs:  clear to auscultation bilaterally, no wheezing, rhonchi or rales  Abd: soft, nontender, no hepatomegaly  Ext: no edema Musculoskeletal:  No deformities, BUE and BLE strength normal and equal Skin: warm and dry  Neuro:  CNs 2-12 intact, no focal abnormalities noted Psych:  Normal affect   EKG:  The EKG was personally reviewed and demonstrates:  SR Telemetry:  Telemetry was personally reviewed and demonstrates:  SR rate 80s  Relevant CV Studies: n/a  Laboratory Data:  High Sensitivity Troponin:   Recent Labs  Lab 09/17/20 1805 09/17/20 2015 09/18/20 0703 09/18/20 1012  TROPONINIHS _2 Chemistry Recent Labs  Lab 09/17/20 1805  NA 137  K 3.7  CL 102  CO2 21*  GLUCOSE 121*  BUN 17  CREATININE 1.17  CALCIUM 9.8  GFRNONAA >60  ANIONGAP 14    No results for input(s): PROT, ALBUMIN, AST, ALT, ALKPHOS, BILITOT in the last 168 hours. Hematology Recent Labs  Lab  09/17/20 1805  WBC 12.2*  RBC 5.61  HGB 16.6  HCT 49.4  MCV 88.1  MCH 29.6  MCHC 33.6  RDW 12.3  PLT 304   BNPNo results for input(s): BNP, PROBNP in the last 168 hours.  DDimer  Recent Labs  Lab 09/17/20 2058  DDIMER 0.42     Radiology/Studies:  DG Chest 2 View  Result Date: 09/17/2020 CLINICAL DATA:  Left-sided chest pain, short of breath, dizziness EXAM: CHEST - 2 VIEW COMPARISON:  06/20/2017 FINDINGS: Frontal  and lateral views of the chest demonstrate an unremarkable cardiac silhouette. No airspace disease, effusion, or pneumothorax. No acute bony abnormalities. IMPRESSION: 1. No acute intrathoracic process. Electronically Signed   By: Randa Ngo M.D.   On: 09/17/2020 20:49   ECHOCARDIOGRAM COMPLETE  Result Date: 09/18/2020    ECHOCARDIOGRAM REPORT   Patient Name:   SAHAN PEN [facility] Date of Exam: 09/18/2020 Medical Rec #:  [id number]      Height:       71.0 in Accession #:    [id number]     Weight:       212.5 lb Date of Birth:  05-26-1951     BSA:          2.164 m Patient Age:    71 years       BP:           173/94 mmHg Patient Gender: M              HR:           81 bpm. Exam Location:  Inpatient Procedure: 2D Echo Indications:    abnormal ecg  History:        Patient has no prior history of Echocardiogram examinations.  Sonographer:    Johny Chess Referring Phys: [place]  1. Left ventricular ejection fraction, by estimation, is 65 to 70%. The left ventricle has normal function. The left ventricle has no regional wall motion abnormalities. There is mild left ventricular hypertrophy. Left ventricular diastolic parameters are consistent with Grade I diastolic dysfunction (impaired relaxation).  2. Right ventricular systolic function is normal. The right ventricular size is normal.  3. The mitral valve is abnormal. Mild mitral valve regurgitation. Moderate to severe mitral annular calcification.  4. The aortic valve is tricuspid. Aortic valve  regurgitation is trivial. Mild aortic valve sclerosis is present, with no evidence of aortic valve stenosis.  5. The inferior vena cava is normal in size with greater than 50% respiratory variability, suggesting right atrial pressure of 3 mmHg. Comparison(s): No prior Echocardiogram. FINDINGS  Left Ventricle: Left ventricular ejection fraction, by estimation, is 65 to 70%. The left ventricle has normal function. The left ventricle has no regional wall motion abnormalities. Definity contrast agent was given IV to delineate the left ventricular  endocardial borders. The left ventricular internal cavity size was normal in size. There is mild left ventricular hypertrophy. Left ventricular diastolic parameters are consistent with Grade I diastolic dysfunction (impaired relaxation). Indeterminate filling pressures. Right Ventricle: The right ventricular size is normal. No increase in right ventricular wall thickness. Right ventricular systolic function is normal. Left Atrium: Left atrial size was normal in size. Right Atrium: Right atrial size was normal in size. Pericardium: There is no evidence of pericardial effusion. Mitral Valve: The mitral valve is abnormal. There is moderate calcification of the posterior mitral valve leaflet(s). Moderate to severe mitral annular calcification. Mild mitral valve regurgitation. MV peak gradient, 5.7 mmHg. The mean mitral valve gradient is 3.0 mmHg. Tricuspid Valve: The tricuspid valve is grossly normal. Tricuspid valve regurgitation is trivial. Aortic Valve: The aortic valve is tricuspid. Aortic valve regurgitation is trivial. Mild aortic valve sclerosis is present, with no evidence of aortic valve stenosis. Pulmonic Valve: The pulmonic valve was grossly normal. Pulmonic valve regurgitation is trivial. Aorta: The aortic root and ascending aorta are structurally normal, with no evidence of dilitation. Venous: The inferior vena cava is normal in size with greater than 50% respiratory  variability, suggesting  right atrial pressure of 3 mmHg. IAS/Shunts: No atrial level shunt detected by color flow Doppler.  LEFT VENTRICLE PLAX 2D LVIDd:         3.40 cm  Diastology LVIDs:         2.00 cm  LV e' medial:    6.31 cm/s LV PW:         1.10 cm  LV E/e' medial:  14.3 LV IVS:        1.10 cm  LV e' lateral:   7.94 cm/s LVOT diam:     2.10 cm  LV E/e' lateral: 11.3 LV SV:         80 LV SV Index:   37 LVOT Area:     3.46 cm  RIGHT VENTRICLE             IVC RV S prime:     12.90 cm/s  IVC diam: 1.70 cm TAPSE (M-mode): 1.4 cm LEFT ATRIUM             Index       RIGHT ATRIUM           Index LA diam:        4.00 cm 1.85 cm/m  RA Area:     13.20 cm LA Vol (A2C):   58.1 ml 26.85 ml/m RA Volume:   30.50 ml  14.10 ml/m LA Vol (A4C):   46.9 ml 21.68 ml/m LA Biplane Vol: 52.5 ml 24.26 ml/m  AORTIC VALVE LVOT Vmax:   110.00 cm/s LVOT Vmean:  73.500 cm/s LVOT VTI:    0.231 m  AORTA Ao Root diam: 3.50 cm Ao Asc diam:  3.50 cm MITRAL VALVE MV Area (PHT): 2.50 cm     SHUNTS MV Peak grad:  5.7 mmHg     Systemic VTI:  0.23 m MV Mean grad:  3.0 mmHg     Systemic Diam: 2.10 cm MV Vmax:       1.19 m/s MV Vmean:      73.9 cm/s MV Decel Time: 303 msec MV E velocity: 90.00 cm/s MV A velocity: 132.00 cm/s MV E/A ratio:  0.68 Lyman Bishop MD Electronically signed by Lyman Bishop MD Signature Date/Time: 09/18/2020/5:24:21 PM    Final      Assessment and Plan:   Active Problems:   Chest pain  Chest pain - possibly cardiac. May represent pericarditis. With his risk factors, evaluate with CCTA would be reasonable next step. Echocardiogram performed, preserved EF, severe MAC, moderate aortic valve calcifications with no significant gradient. No pericardial effusion.  - symptoms also sound pleuritic. Will obtain esr, crp and will evaluate for pericarditis.   HTN urgency -  Continue irbesartan, carvedilol. BP still elevated, consider adding amlodipine tomorrow.   HLD - historical. Not on therapy. Will recheck  lipids.  Prediabetes - will recheck HbA1c   For questions or updates, please contact [place] Please consult [email or web address] for contact info under    Signed, Elouise Munroe, MD  09/18/2020 6:56 PM

## 2020-09-18 NOTE — H&P (Signed)
History and Physical    Justin Waters OYD:[id number] DOB: 1951-01-29 DOA: 09/17/2020  I have briefly reviewed the patient's prior medical records in [place]  PCP: Patient, No Pcp Per  Patient coming from: home via [facility]  Chief Complaint: Elevated blood pressure, chest pain  HPI: Justin Waters is a 69 y.o. male with medical history significant of hypertension, overweight, comes to the hospital with complaints of elevated blood pressure and chest pain.  Patient tells me that yesterday, on Sunday, he was watching football, sitting on the couch when all of a sudden he started experiencing generalized sense of feeling unwell, foggy headed and weak.  Patient tells me he was diagnosed with high blood pressure few weeks before.  He was supposed to get a colonoscopy, and upon evaluation he was found to be hypertensive with systolic in the 672C.  He was just recently placed on irbesartan and started taking for the past couple of days.  Thinking that his ill feeling might have something to do with a high blood pressure, went to [facility] and checked his BP, and was found to be around 947 systolic over 096 diastolic.  During this episode, he also started experiencing left-sided chest pain, pressure-like, nonradiating.  Chest pressure would be worse with deep breathing.  He called EMS and was brought to the ER.  He feels like his chest discomfort has been constant since it appeared, eased off at times by the nitroglycerin he received in the ER.  He also reports that over the last couple of months he has been feeling weaker than normal, fatigue, and has been having more shortness of breath  ED Course: In the ED he is afebrile, tachycardic into the 90s-100, hypertensive with blood pressure into the 170s and satting well on room air.  His blood work shows normal renal function, high-sensitivity troponin negative x4. Covid negative. D dimer was negative.  EKG shows sinus rhythm  Review of Systems: All systems  reviewed, and apart from HPI, all negative  Past Medical History:  Diagnosis Date  . Arthritis   . Bipolar 1 disorder ([place])    ?? off meds 2012  . GERD (gastroesophageal reflux disease)   . History of inguinal hernia repair   . Hx of colonic polyps   . Hypertension     Past Surgical History:  Procedure Laterality Date  . HERNIA REPAIR  2007   lap RIH TEP  . HERNIA REPAIR  08/20/2011   lap LIH  . VASECTOMY       reports that he has quit smoking. He quit smokeless tobacco use about 8 years ago. He reports current alcohol use. He reports that he does not use drugs.  No Known Allergies  Family History  Problem Relation Age of Onset  . Asthma Mother   . COPD Father     Prior to Admission medications   Medication Sig Start Date End Date Taking? Authorizing Provider  ibuprofen (ADVIL,MOTRIN) 200 MG tablet Take 200 mg by mouth every 6 (six) hours as needed.    [provider]    Physical Exam: Vitals:   09/18/20 0704 09/18/20 1000 09/18/20 1130 09/18/20 1238  BP: (!) 193/93 (!) 161/95 (!) 157/90 (!) 173/94  Pulse: 76 100 95 99  Resp: (!) 22 18 20 20   Temp:    98.6 F (37 C)  TempSrc:    Oral  SpO2: 96% 96% 96% 94%  Weight:    96.4 kg  Height:  5\' 11"  (1.803 m)   Constitutional: NAD, calm, comfortable Eyes: PERRL, lids and conjunctivae normal ENMT: Mucous membranes are moist.  Neck: normal, supple Respiratory: clear to auscultation bilaterally, no wheezing, no crackles. Normal respiratory effort. No accessory muscle use.  Cardiovascular: Regular rate and rhythm, no murmurs / rubs / gallops. No extremity edema.   Abdomen: no tenderness, no masses palpated. Bowel sounds positive.  Musculoskeletal: no clubbing / cyanosis. Normal muscle tone.  Skin: no rashes, lesions, ulcers. No induration Neurologic: CN 2-12 grossly intact. Strength 5/5 in all 4.  Psychiatric: Normal judgment and insight. Alert and oriented x 3. Normal mood.   Labs on Admission: I have  personally reviewed following labs and imaging studies  CBC: Recent Labs  Lab 09/17/20 1805  WBC 12.2*  HGB 16.6  HCT 49.4  MCV 88.1  PLT 778   Basic Metabolic Panel: Recent Labs  Lab 09/17/20 1805  NA 137  K 3.7  CL 102  CO2 21*  GLUCOSE 121*  BUN 17  CREATININE 1.17  CALCIUM 9.8   Liver Function Tests: No results for input(s): AST, ALT, ALKPHOS, BILITOT, PROT, ALBUMIN in the last 168 hours. Coagulation Profile: No results for input(s): INR, PROTIME in the last 168 hours. BNP (last 3 results) No results for input(s): PROBNP in the last 8760 hours. CBG: No results for input(s): GLUCAP in the last 168 hours. Thyroid Function Tests: No results for input(s): TSH, T4TOTAL, FREET4, T3FREE, THYROIDAB in the last 72 hours. Urine analysis:    Component Value Date/Time   BILIRUBINUR neg 02/17/2013 1853   PROTEINUR neg 02/17/2013 1853   UROBILINOGEN 0.2 02/17/2013 1853   NITRITE neg 02/17/2013 1853   LEUKOCYTESUR Negative 02/17/2013 1853     Radiological Exams on Admission: DG Chest 2 View  Result Date: 09/17/2020 CLINICAL DATA:  Left-sided chest pain, short of breath, dizziness EXAM: CHEST - 2 VIEW COMPARISON:  06/20/2017 FINDINGS: Frontal and lateral views of the chest demonstrate an unremarkable cardiac silhouette. No airspace disease, effusion, or pneumothorax. No acute bony abnormalities. IMPRESSION: 1. No acute intrathoracic process. Electronically Signed   By: Randa Ngo M.D.   On: 09/17/2020 20:49    EKG: Independenty reviewed.  Sinus rhythm  Assessment/Plan  Principal Problem Hypertensive urgency -Patient with significant hypotensive episode at home, blood pressure in the 242P systolic, experiencing chest pain as well. -Patient was started on ARB as an outpatient, continue that here.  He is slightly tachycardic, start carvedilol -Obtain a 2D echo  Active Problems Chest pressure -Ruled out for a non-STEMI, this is likely due to his hypertensive  urgency.  He has been having persistent chest pressure since yesterday and high-sensitivity troponins were negative -Obtain a 2D echo  Progressive generalized fatigue, shortness of breath -More concerning symptoms are patient's progressive generalized fatigue, shortness of breath with activities that have been ongoing for the past couple of months.  He does not have any personal history of coronary artery disease but definitely has risk factors.  I have consulted cardiology to see if ischemic evaluation with stress test is warranted in-house at this point   DVT prophylaxis: Lovenox  Code Status: Full code  Family Communication: no family at bedside  Disposition Plan: home when ready  Bed Type: telemetry  Consults called: cardiology   Obs/Inp: observation   Marzetta Board, MD, PhD [facility]  Contact via [email or web address]  09/18/2020, 12:58 PM

## 2020-09-18 NOTE — Plan of Care (Signed)
  Problem: Activity: Goal: Capacity to carry out activities will improve Outcome: Progressing   Problem: Cardiac: Goal: Ability to achieve and maintain adequate cardiopulmonary perfusion will improve Outcome: Progressing   Problem: Activity: Goal: Risk for activity intolerance will decrease Outcome: Progressing   Problem: Coping: Goal: Level of anxiety will decrease Outcome: Progressing   Problem: Pain Managment: Goal: General experience of comfort will improve Outcome: Progressing   Problem: Safety: Goal: Ability to remain free from injury will improve Outcome: Progressing

## 2020-09-19 ENCOUNTER — Observation Stay (HOSPITAL_COMMUNITY): Payer: Medicare Other

## 2020-09-19 DIAGNOSIS — E785 Hyperlipidemia, unspecified: Secondary | ICD-10-CM | POA: Diagnosis not present

## 2020-09-19 DIAGNOSIS — I2510 Atherosclerotic heart disease of native coronary artery without angina pectoris: Secondary | ICD-10-CM | POA: Diagnosis not present

## 2020-09-19 DIAGNOSIS — I160 Hypertensive urgency: Secondary | ICD-10-CM

## 2020-09-19 DIAGNOSIS — R079 Chest pain, unspecified: Secondary | ICD-10-CM

## 2020-09-19 LAB — COMPREHENSIVE METABOLIC PANEL
ALT: 31 U/L (ref 0–44)
AST: 25 U/L (ref 15–41)
Albumin: 3.7 g/dL (ref 3.5–5.0)
Alkaline Phosphatase: 57 U/L (ref 38–126)
Anion gap: 12 (ref 5–15)
BUN: 19 mg/dL (ref 8–23)
CO2: 20 mmol/L — ABNORMAL LOW (ref 22–32)
Calcium: 9.2 mg/dL (ref 8.9–10.3)
Chloride: 107 mmol/L (ref 98–111)
Creatinine, Ser: 1.12 mg/dL (ref 0.61–1.24)
GFR, Estimated: >60 mL/min (ref >60)
Glucose, Bld: 115 mg/dL — ABNORMAL HIGH (ref 70–99)
Potassium: 4.2 mmol/L (ref 3.5–5.1)
Sodium: 139 mmol/L (ref 135–145)
Total Bilirubin: 1 mg/dL (ref 0.3–1.2)
Total Protein: 6.8 g/dL (ref 6.5–8.1)

## 2020-09-19 LAB — CBC
HCT: 46.7 % (ref 39.0–52.0)
Hemoglobin: 15.6 g/dL (ref 13.0–17.0)
MCH: 29.8 pg (ref 26.0–34.0)
MCHC: 33.4 g/dL (ref 30.0–36.0)
MCV: 89.3 fL (ref 80.0–100.0)
Platelets: 194 10*3/uL (ref 150–400)
RBC: 5.23 MIL/uL (ref 4.22–5.81)
RDW: 12.5 % (ref 11.5–15.5)
WBC: 7.5 10*3/uL (ref 4.0–10.5)
nRBC: 0 % (ref 0.0–0.2)

## 2020-09-19 LAB — LIPID PANEL
Cholesterol: 270 mg/dL — ABNORMAL HIGH (ref 0–200)
HDL: 33 mg/dL — ABNORMAL LOW (ref >40)
LDL Cholesterol: 201 mg/dL — ABNORMAL HIGH (ref 0–99)
Total CHOL/HDL Ratio: 8.2 RATIO
Triglycerides: 179 mg/dL — ABNORMAL HIGH (ref <150)
VLDL: 36 mg/dL (ref 0–40)

## 2020-09-19 LAB — HIV ANTIBODY (ROUTINE TESTING W REFLEX): HIV Screen 4th Generation wRfx: NONREACTIVE

## 2020-09-19 LAB — C-REACTIVE PROTEIN: CRP: 0.8 mg/dL (ref <1.0)

## 2020-09-19 LAB — HEMOGLOBIN A1C
Hgb A1c MFr Bld: 6.4 % — ABNORMAL HIGH (ref 4.8–5.6)
Mean Plasma Glucose: 136.98 mg/dL

## 2020-09-19 LAB — SEDIMENTATION RATE: Sed Rate: 1 mm/hr (ref 0–16)

## 2020-09-19 MED ORDER — IOHEXOL 350 MG/ML SOLN
80.0000 mL | Freq: Once | INTRAVENOUS | Status: AC | PRN
  Administered 2020-09-19: 80 mL via INTRAVENOUS

## 2020-09-19 MED ORDER — ROSUVASTATIN CALCIUM 5 MG PO TABS
10.0000 mg | ORAL_TABLET | Freq: Every day | ORAL | Status: DC
  Administered 2020-09-19: 10 mg via ORAL
  Filled 2020-09-19: qty 2

## 2020-09-19 MED ORDER — ROSUVASTATIN CALCIUM 20 MG PO TABS
40.0000 mg | ORAL_TABLET | Freq: Every day | ORAL | Status: DC
  Administered 2020-09-20: 40 mg via ORAL
  Filled 2020-09-19: qty 2

## 2020-09-19 MED ORDER — NITROGLYCERIN 0.4 MG SL SUBL
SUBLINGUAL_TABLET | SUBLINGUAL | Status: AC
  Filled 2020-09-19: qty 2

## 2020-09-19 MED ORDER — CARVEDILOL 12.5 MG PO TABS
12.5000 mg | ORAL_TABLET | Freq: Two times a day (BID) | ORAL | Status: DC
  Administered 2020-09-19 – 2020-09-20 (×2): 12.5 mg via ORAL
  Filled 2020-09-19 (×2): qty 1

## 2020-09-19 NOTE — Progress Notes (Addendum)
Progress Note  Patient Name: Justin Waters Date of Encounter: 09/19/2020  [facility] Cardiologist: Elouise Munroe, MD   Subjective   Pending result of coronary CT. Chest pain is improving.   Inpatient Medications    Scheduled Meds: . aspirin EC  81 mg Oral Daily  . carvedilol  6.25 mg Oral BID WC  . enoxaparin (LOVENOX) injection  40 mg Subcutaneous Q24H  . irbesartan  150 mg Oral Daily  . nitroGLYCERIN      . rosuvastatin  10 mg Oral Daily  . sodium chloride flush  3 mL Intravenous Q12H   Continuous Infusions: . sodium chloride     PRN Meds: sodium chloride, acetaminophen **OR** acetaminophen, nitroGLYCERIN, ondansetron **OR** ondansetron (ZOFRAN) IV, sodium chloride flush   Vital Signs    Vitals:   09/18/20 1238 09/18/20 1949 09/19/20 0606 09/19/20 0803  BP: (!) 173/94 (!) 148/92 117/75 117/83  Pulse: 99 80 63 64  Resp: 20 16 16    Temp: 98.6 F (37 C) 98.2 F (36.8 C) 97.7 F (36.5 C)   TempSrc: Oral Oral Oral   SpO2: 94% 95% 94% 96%  Weight: 96.4 kg  96.3 kg   Height: 5\' 11"  (1.803 m)       Intake/Output Summary (Last 24 hours) at 09/19/2020 1015 Last data filed at 09/19/2020 [id number] Gross per 24 hour  Intake 922 ml  Output -  Net 922 ml   Last 3 Weights 09/19/2020 09/18/2020 09/17/2020  Weight (lbs) 212 lb 4.9 oz 212 lb 8.4 oz 215 lb  Weight (kg) 96.3 kg 96.4 kg 97.523 kg      Telemetry    Sinus rhythm - Personally Reviewed  ECG    No new tracing   Physical Exam   GEN: No acute distress.   Neck: No JVD Cardiac: RRR, no murmurs, rubs, or gallops.  Respiratory: Clear to auscultation bilaterally. GI: Soft, nontender, non-distended  MS: No edema; No deformity. Neuro:  Nonfocal  Psych: Normal affect   Labs    High Sensitivity Troponin:   Recent Labs  Lab 09/17/20 1805 09/17/20 2015 09/18/20 0703 09/18/20 1012  TROPONINIHS 9 14 15 15       Chemistry Recent Labs  Lab 09/17/20 1805 09/19/20 0307  NA 137 139  K 3.7 4.2   CL 102 107  CO2 21* 20*  GLUCOSE 121* 115*  BUN 17 19  CREATININE 1.17 1.12  CALCIUM 9.8 9.2  PROT  --  6.8  ALBUMIN  --  3.7  AST  --  25  ALT  --  31  ALKPHOS  --  57  BILITOT  --  1.0  GFRNONAA >60 >60  ANIONGAP 14 12     Hematology Recent Labs  Lab 09/17/20 1805 09/19/20 0307  WBC 12.2* 7.5  RBC 5.61 5.23  HGB 16.6 15.6  HCT 49.4 46.7  MCV 88.1 89.3  MCH 29.6 29.8  MCHC 33.6 33.4  RDW 12.3 12.5  PLT 304 194    DDimer  Recent Labs  Lab 09/17/20 2058  DDIMER 0.42     Radiology    DG Chest 2 View  Result Date: 09/17/2020 CLINICAL DATA:  Left-sided chest pain, short of breath, dizziness EXAM: CHEST - 2 VIEW COMPARISON:  06/20/2017 FINDINGS: Frontal and lateral views of the chest demonstrate an unremarkable cardiac silhouette. No airspace disease, effusion, or pneumothorax. No acute bony abnormalities. IMPRESSION: 1. No acute intrathoracic process. Electronically Signed   By: Randa Ngo M.D.   On:  09/17/2020 20:49   ECHOCARDIOGRAM COMPLETE  Result Date: 09/18/2020    ECHOCARDIOGRAM REPORT   Patient Name:   Justin Waters [facility] Date of Exam: 09/18/2020 Medical Rec #:  [id number]      Height:       71.0 in Accession #:    [id number]     Weight:       212.5 lb Date of Birth:  12-Dec-1950     BSA:          2.164 m Patient Age:    89 years       BP:           173/94 mmHg Patient Gender: M              HR:           81 bpm. Exam Location:  Inpatient Procedure: 2D Echo Indications:    abnormal ecg  History:        Patient has no prior history of Echocardiogram examinations.  Sonographer:    Johny Chess Referring Phys: [place]  1. Left ventricular ejection fraction, by estimation, is 65 to 70%. The left ventricle has normal function. The left ventricle has no regional wall motion abnormalities. There is mild left ventricular hypertrophy. Left ventricular diastolic parameters are consistent with Grade I diastolic dysfunction (impaired relaxation).   2. Right ventricular systolic function is normal. The right ventricular size is normal.  3. The mitral valve is abnormal. Mild mitral valve regurgitation. Moderate to severe mitral annular calcification.  4. The aortic valve is tricuspid. Aortic valve regurgitation is trivial. Mild aortic valve sclerosis is present, with no evidence of aortic valve stenosis.  5. The inferior vena cava is normal in size with greater than 50% respiratory variability, suggesting right atrial pressure of 3 mmHg. Comparison(s): No prior Echocardiogram. FINDINGS  Left Ventricle: Left ventricular ejection fraction, by estimation, is 65 to 70%. The left ventricle has normal function. The left ventricle has no regional wall motion abnormalities. Definity contrast agent was given IV to delineate the left ventricular  endocardial borders. The left ventricular internal cavity size was normal in size. There is mild left ventricular hypertrophy. Left ventricular diastolic parameters are consistent with Grade I diastolic dysfunction (impaired relaxation). Indeterminate filling pressures. Right Ventricle: The right ventricular size is normal. No increase in right ventricular wall thickness. Right ventricular systolic function is normal. Left Atrium: Left atrial size was normal in size. Right Atrium: Right atrial size was normal in size. Pericardium: There is no evidence of pericardial effusion. Mitral Valve: The mitral valve is abnormal. There is moderate calcification of the posterior mitral valve leaflet(s). Moderate to severe mitral annular calcification. Mild mitral valve regurgitation. MV peak gradient, 5.7 mmHg. The mean mitral valve gradient is 3.0 mmHg. Tricuspid Valve: The tricuspid valve is grossly normal. Tricuspid valve regurgitation is trivial. Aortic Valve: The aortic valve is tricuspid. Aortic valve regurgitation is trivial. Mild aortic valve sclerosis is present, with no evidence of aortic valve stenosis. Pulmonic Valve: The  pulmonic valve was grossly normal. Pulmonic valve regurgitation is trivial. Aorta: The aortic root and ascending aorta are structurally normal, with no evidence of dilitation. Venous: The inferior vena cava is normal in size with greater than 50% respiratory variability, suggesting right atrial pressure of 3 mmHg. IAS/Shunts: No atrial level shunt detected by color flow Doppler.  LEFT VENTRICLE PLAX 2D LVIDd:         3.40 cm  Diastology LVIDs:  2.00 cm  LV e' medial:    6.31 cm/s LV PW:         1.10 cm  LV E/e' medial:  14.3 LV IVS:        1.10 cm  LV e' lateral:   7.94 cm/s LVOT diam:     2.10 cm  LV E/e' lateral: 11.3 LV SV:         80 LV SV Index:   37 LVOT Area:     3.46 cm  RIGHT VENTRICLE             IVC RV S prime:     12.90 cm/s  IVC diam: 1.70 cm TAPSE (M-mode): 1.4 cm LEFT ATRIUM             Index       RIGHT ATRIUM           Index LA diam:        4.00 cm 1.85 cm/m  RA Area:     13.20 cm LA Vol (A2C):   58.1 ml 26.85 ml/m RA Volume:   30.50 ml  14.10 ml/m LA Vol (A4C):   46.9 ml 21.68 ml/m LA Biplane Vol: 52.5 ml 24.26 ml/m  AORTIC VALVE LVOT Vmax:   110.00 cm/s LVOT Vmean:  73.500 cm/s LVOT VTI:    0.231 m  AORTA Ao Root diam: 3.50 cm Ao Asc diam:  3.50 cm MITRAL VALVE MV Area (PHT): 2.50 cm     SHUNTS MV Peak grad:  5.7 mmHg     Systemic VTI:  0.23 m MV Mean grad:  3.0 mmHg     Systemic Diam: 2.10 cm MV Vmax:       1.19 m/s MV Vmean:      73.9 cm/s MV Decel Time: 303 msec MV E velocity: 90.00 cm/s MV A velocity: 132.00 cm/s MV E/A ratio:  0.68 Lyman Bishop MD Electronically signed by Lyman Bishop MD Signature Date/Time: 09/18/2020/5:24:21 PM    Final     Cardiac Studies   Echo 09/18/2020 1. Left ventricular ejection fraction, by estimation, is 65 to 70%. The  left ventricle has normal function. The left ventricle has no regional  wall motion abnormalities. There is mild left ventricular hypertrophy.  Left ventricular diastolic parameters  are consistent with Grade I diastolic  dysfunction (impaired relaxation).  2. Right ventricular systolic function is normal. The right ventricular  size is normal.  3. The mitral valve is abnormal. Mild mitral valve regurgitation.  Moderate to severe mitral annular calcification.  4. The aortic valve is tricuspid. Aortic valve regurgitation is trivial.  Mild aortic valve sclerosis is present, with no evidence of aortic valve  stenosis.  5. The inferior vena cava is normal in size with greater than 50%  respiratory variability, suggesting right atrial pressure of 3 mmHg.   Patient Profile     69 y.o. male  with a hx of HTN, prediabetes, hyperlipidemia, 30 pk year smoking history presented for chest pain evaluation in setting of HTN urgency.   Assessment & Plan    1. Chest pain  - Some pleuritic symptoms but not classing. Normal troponin, CRP, sed rate and d-dimer.  - Echo with preserved LVEF without WM abnormality - Pain improving with improved BP - Pending reading of Coronary CT  2. Hypertensive urgency  - Continue current therapy  3. HLD - 09/19/2020: Cholesterol 270; HDL 33; LDL Cholesterol 201; Triglycerides 179; VLDL 36 - Continue Crestor 10mg  qd>> if CAD, up titrate  4. Pre-diabetic - HgbA1c 6.4    For questions or updates, please contact [place] Please consult [email or web address] for contact info under   SignedLeanor Kail, PA  09/19/2020, 10:15 AM    Patient seen and examined with [facility].  Agree as above, with the following exceptions and changes as noted below. Feeling better today with less chest pain. Seen right after CT. Gen: NAD, CV: RRR, 1/6 SEM lungs: clear, Abd: soft, Extrem: Warm, well perfused, no edema, Neuro/Psych: alert and oriented x 3, normal mood and affect. All available labs, radiology testing, previous records reviewed.   Cholesterol is quite high with LDL of 201. Will move to high intensity statin therapy. He has a coronary calcium score of 2111 on his CT. Further  interpretation is pending and I will update the patient with results.  CRP and sed rate are unremarkable. While this could still represent pericarditis, less likely with normal ECG, normal inflammatory markers, no pericardial effusion. Symptoms may be more consistent with coronary disease and HTN.   Prediabetic with A1C of 6.4%. Per primary team.   HTN urgency - he received 100 mg of metoprolol for his CT scan, and his BP and heart rate have improved. We will uptitrate his carvedilol.   Elouise Munroe, MD 09/19/20 11:20 AM

## 2020-09-19 NOTE — Progress Notes (Signed)
[facility] PROGRESS NOTE    Progress Note  Justin Waters  [id number] DOB: Nov 29, 1950 DOA: 09/17/2020 PCP: Patient, No Pcp Per     Brief Narrative:   Justin Waters is an 69 y.o. male past medical history significant for essential hypertension comes into the hospital for chest pain and elevated blood pressure he was recently started on Avapro.  In the ED he was found to be tachycardic with a blood pressure in the 170s satting on room air cardiac biomarkers negative x3 SARS-CoV-2 PCR negative, D-dimer unremarkable.  Assessment/Plan:   Hypertensive urgency: He was continue on his Avapro, started on Coreg in house. Cardiac biomarkers have remained negative till date. 2D echo showed an EF of 123456 grade 1 diastolic heart failure no wall motion abnormality. Shortness morning is at goal    Chest pain 2D echo was done that showed an EF of 123456 grade 1 diastolic heart failure and severe mitral annular calcification. Cardiology was consulted who recommended a CTA which is pending at the time of this dictation.  Progressive generalized fatigue/shortness of breath: Cardiology has been consulted to have, they have ordered a CTA. Cardiac biomarkers have remained negative x2, CRP 0.8.  Hyperlipidemia: HDL less than 40 LDL greater than 130 Ahead and start him on Crestor.    DVT prophylaxis: lovenxo Family Communication:none Status is: Observation  The patient remains OBS appropriate and will d/c before 2 midnights.  Dispo: The patient is from: Home              Anticipated d/c is to: Home              Anticipated d/c date is: 1 day              Patient currently is medically stable to d/c.        Code Status:     Code Status Orders  (From admission, onward)         Start     Ordered   09/18/20 1258  Full code  Continuous        09/18/20 1258        Code Status History    This patient has a current code status but no historical code status.   Advance Care  Planning Activity        IV Access:    Peripheral IV   Procedures and diagnostic studies:   DG Chest 2 View  Result Date: 09/17/2020 CLINICAL DATA:  Left-sided chest pain, short of breath, dizziness EXAM: CHEST - 2 VIEW COMPARISON:  06/20/2017 FINDINGS: Frontal and lateral views of the chest demonstrate an unremarkable cardiac silhouette. No airspace disease, effusion, or pneumothorax. No acute bony abnormalities. IMPRESSION: 1. No acute intrathoracic process. Electronically Signed   By: Justin Waters M.D.   On: 09/17/2020 20:49   ECHOCARDIOGRAM COMPLETE  Result Date: 09/18/2020    ECHOCARDIOGRAM REPORT   Patient Name:   Justin Waters [facility] Date of Exam: 09/18/2020 Medical Rec #:  [id number]      Height:       71.0 in Accession #:    [id number]     Weight:       212.5 lb Date of Birth:  12/14/1950     BSA:          2.164 m Patient Age:    66 years       BP:           173/94 mmHg Patient Gender: M  HR:           81 bpm. Exam Location:  Inpatient Procedure: 2D Echo Indications:    abnormal ecg  History:        Patient has no prior history of Echocardiogram examinations.  Sonographer:    Johny Chess Referring Phys: [place]  1. Left ventricular ejection fraction, by estimation, is 65 to 70%. The left ventricle has normal function. The left ventricle has no regional wall motion abnormalities. There is mild left ventricular hypertrophy. Left ventricular diastolic parameters are consistent with Grade I diastolic dysfunction (impaired relaxation).  2. Right ventricular systolic function is normal. The right ventricular size is normal.  3. The mitral valve is abnormal. Mild mitral valve regurgitation. Moderate to severe mitral annular calcification.  4. The aortic valve is tricuspid. Aortic valve regurgitation is trivial. Mild aortic valve sclerosis is present, with no evidence of aortic valve stenosis.  5. The inferior vena cava is normal in size with greater  than 50% respiratory variability, suggesting right atrial pressure of 3 mmHg. Comparison(s): No prior Echocardiogram. FINDINGS  Left Ventricle: Left ventricular ejection fraction, by estimation, is 65 to 70%. The left ventricle has normal function. The left ventricle has no regional wall motion abnormalities. Definity contrast agent was given IV to delineate the left ventricular  endocardial borders. The left ventricular internal cavity size was normal in size. There is mild left ventricular hypertrophy. Left ventricular diastolic parameters are consistent with Grade I diastolic dysfunction (impaired relaxation). Indeterminate filling pressures. Right Ventricle: The right ventricular size is normal. No increase in right ventricular wall thickness. Right ventricular systolic function is normal. Left Atrium: Left atrial size was normal in size. Right Atrium: Right atrial size was normal in size. Pericardium: There is no evidence of pericardial effusion. Mitral Valve: The mitral valve is abnormal. There is moderate calcification of the posterior mitral valve leaflet(s). Moderate to severe mitral annular calcification. Mild mitral valve regurgitation. MV peak gradient, 5.7 mmHg. The mean mitral valve gradient is 3.0 mmHg. Tricuspid Valve: The tricuspid valve is grossly normal. Tricuspid valve regurgitation is trivial. Aortic Valve: The aortic valve is tricuspid. Aortic valve regurgitation is trivial. Mild aortic valve sclerosis is present, with no evidence of aortic valve stenosis. Pulmonic Valve: The pulmonic valve was grossly normal. Pulmonic valve regurgitation is trivial. Aorta: The aortic root and ascending aorta are structurally normal, with no evidence of dilitation. Venous: The inferior vena cava is normal in size with greater than 50% respiratory variability, suggesting right atrial pressure of 3 mmHg. IAS/Shunts: No atrial level shunt detected by color flow Doppler.  LEFT VENTRICLE PLAX 2D LVIDd:         3.40  cm  Diastology LVIDs:         2.00 cm  LV e' medial:    6.31 cm/s LV PW:         1.10 cm  LV E/e' medial:  14.3 LV IVS:        1.10 cm  LV e' lateral:   7.94 cm/s LVOT diam:     2.10 cm  LV E/e' lateral: 11.3 LV SV:         80 LV SV Index:   37 LVOT Area:     3.46 cm  RIGHT VENTRICLE             IVC RV S prime:     12.90 cm/s  IVC diam: 1.70 cm TAPSE (M-mode): 1.4 cm LEFT ATRIUM  Index       RIGHT ATRIUM           Index LA diam:        4.00 cm 1.85 cm/m  RA Area:     13.20 cm LA Vol (A2C):   58.1 ml 26.85 ml/m RA Volume:   30.50 ml  14.10 ml/m LA Vol (A4C):   46.9 ml 21.68 ml/m LA Biplane Vol: 52.5 ml 24.26 ml/m  AORTIC VALVE LVOT Vmax:   110.00 cm/s LVOT Vmean:  73.500 cm/s LVOT VTI:    0.231 m  AORTA Ao Root diam: 3.50 cm Ao Asc diam:  3.50 cm MITRAL VALVE MV Area (PHT): 2.50 cm     SHUNTS MV Peak grad:  5.7 mmHg     Systemic VTI:  0.23 m MV Mean grad:  3.0 mmHg     Systemic Diam: 2.10 cm MV Vmax:       1.19 m/s MV Vmean:      73.9 cm/s MV Decel Time: 303 msec MV E velocity: 90.00 cm/s MV A velocity: 132.00 cm/s MV E/A ratio:  0.68 Lyman Bishop MD Electronically signed by Lyman Bishop MD Signature Date/Time: 09/18/2020/5:24:21 PM    Final      Medical Consultants:    None.  Anti-Infectives:   none  Subjective:    Justin Waters relates he feels great no new complaints.  Objective:    Vitals:   09/18/20 1130 09/18/20 1238 09/18/20 1949 09/19/20 0606  BP: (!) 157/90 (!) 173/94 (!) 148/92 117/75  Pulse: 95 99 80 63  Resp: 20 20 16 16   Temp:  98.6 F (37 C) 98.2 F (36.8 C) 97.7 F (36.5 C)  TempSrc:  Oral Oral Oral  SpO2: 96% 94% 95% 94%  Weight:  96.4 kg  96.3 kg  Height:  5\' 11"  (1.803 m)     SpO2: 94 %   Intake/Output Summary (Last 24 hours) at 09/19/2020 0736 Last data filed at 09/18/2020 2200 Gross per 24 hour  Intake 462 ml  Output --  Net 462 ml   Filed Weights   09/17/20 1756 09/18/20 1238 09/19/20 0606  Weight: 97.5 kg 96.4 kg 96.3 kg     Exam: General exam: In no acute distress. Respiratory system: Good air movement and clear to auscultation. Cardiovascular system: S1 & S2 heard, RRR. No JVD. Gastrointestinal system: Abdomen is nondistended, soft and nontender.  Extremities: No pedal edema. Skin: No rashes, lesions or ulcers Psychiatry: Judgement and insight appear normal. Mood & affect appropriate.    Data Reviewed:    Labs: Basic Metabolic Panel: Recent Labs  Lab 09/17/20 1805 09/19/20 0307  NA 137 139  K 3.7 4.2  CL 102 107  CO2 21* 20*  GLUCOSE 121* 115*  BUN 17 19  CREATININE 1.17 1.12  CALCIUM 9.8 9.2   GFR Estimated Creatinine Clearance: 74.7 mL/min (by C-G formula based on SCr of 1.12 mg/dL). Liver Function Tests: Recent Labs  Lab 09/19/20 0307  AST 25  ALT 31  ALKPHOS 57  BILITOT 1.0  PROT 6.8  ALBUMIN 3.7   No results for input(s): LIPASE, AMYLASE in the last 168 hours. No results for input(s): AMMONIA in the last 168 hours. Coagulation profile No results for input(s): INR, PROTIME in the last 168 hours. COVID-19 Labs  Recent Labs    09/17/20 2058 09/19/20 0307  DDIMER 0.42  --   CRP  --  0.8    Lab Results  Component Value Date  [place] NEGATIVE 09/17/2020    CBC: Recent Labs  Lab 09/17/20 1805 09/19/20 0307  WBC 12.2* 7.5  HGB 16.6 15.6  HCT 49.4 46.7  MCV 88.1 89.3  PLT 304 194   Cardiac Enzymes: No results for input(s): CKTOTAL, CKMB, CKMBINDEX, TROPONINI in the last 168 hours. BNP (last 3 results) No results for input(s): PROBNP in the last 8760 hours. CBG: No results for input(s): GLUCAP in the last 168 hours. D-Dimer: Recent Labs    09/17/20 2058  DDIMER 0.42   Hgb A1c: Recent Labs    09/19/20 0307  HGBA1C 6.4*   Lipid Profile: Recent Labs    09/19/20 0307  CHOL 270*  HDL 33*  LDLCALC 201*  TRIG 179*  CHOLHDL 8.2   Thyroid function studies: No results for input(s): TSH, T4TOTAL, T3FREE, THYROIDAB in the last 72  hours.  Invalid input(s): FREET3 Anemia work up: No results for input(s): VITAMINB12, FOLATE, FERRITIN, TIBC, IRON, RETICCTPCT in the last 72 hours. Sepsis Labs: Recent Labs  Lab 09/17/20 1805 09/19/20 0307  WBC 12.2* 7.5   Microbiology Recent Results (from the past 240 hour(s))  Resp Panel by RT-PCR (Flu A&B, Covid) Nasopharyngeal Swab     Status: None   Collection Time: 09/17/20 10:00 PM   Specimen: Nasopharyngeal Swab; Nasopharyngeal(NP) swabs in vial transport medium  Result Value Ref Range Status   SARS Coronavirus 2 by RT PCR NEGATIVE NEGATIVE Final    Comment: (NOTE) SARS-CoV-2 target nucleic acids are NOT DETECTED.  The SARS-CoV-2 RNA is generally detectable in upper respiratory specimens during the acute phase of infection. The lowest concentration of SARS-CoV-2 viral copies this assay can detect is 138 copies/mL. A negative result does not preclude SARS-Cov-2 infection and should not be used as the sole basis for treatment or other patient management decisions. A negative result may occur with  improper specimen collection/handling, submission of specimen other than nasopharyngeal swab, presence of viral mutation(s) within the areas targeted by this assay, and inadequate number of viral copies(<138 copies/mL). A negative result must be combined with clinical observations, patient history, and epidemiological information. The expected result is Negative.  Fact Sheet for Patients:  EntrepreneurPulse.com.au  Fact Sheet for Healthcare Providers:  IncredibleEmployment.be  This test is no t yet approved or cleared by the [place] FDA and  has been authorized for detection and/or diagnosis of SARS-CoV-2 by FDA under an Emergency Use Authorization (EUA). This EUA will remain  in effect (meaning this test can be used) for the duration of the COVID-19 declaration under Section 564(b)(1) of the Act, 21 U.S.C.section 360bbb-3(b)(1),  unless the authorization is terminated  or revoked sooner.       Influenza A by PCR NEGATIVE NEGATIVE Final   Influenza B by PCR NEGATIVE NEGATIVE Final    Comment: (NOTE) The Xpert Xpress SARS-CoV-2/FLU/RSV plus assay is intended as an aid in the diagnosis of influenza from Nasopharyngeal swab specimens and should not be used as a sole basis for treatment. Nasal washings and aspirates are unacceptable for Xpert Xpress SARS-CoV-2/FLU/RSV testing.  Fact Sheet for Patients: EntrepreneurPulse.com.au  Fact Sheet for Healthcare Providers: IncredibleEmployment.be  This test is not yet approved or cleared by the [place] FDA and has been authorized for detection and/or diagnosis of SARS-CoV-2 by FDA under an Emergency Use Authorization (EUA). This EUA will remain in effect (meaning this test can be used) for the duration of the COVID-19 declaration under Section 564(b)(1) of the Act, 21 U.S.C. section 360bbb-3(b)(1), unless the authorization is  terminated or revoked.  Performed at [facility], [address] [place], [place] [address]      Medications:   . aspirin EC  81 mg Oral Daily  . carvedilol  6.25 mg Oral BID WC  . enoxaparin (LOVENOX) injection  40 mg Subcutaneous Q24H  . irbesartan  150 mg Oral Daily  . sodium chloride flush  3 mL Intravenous Q12H   Continuous Infusions: . sodium chloride        LOS: 0 days   Charlynne Cousins  [facility]  09/19/2020, 7:36 AM

## 2020-09-19 NOTE — Consult Note (Signed)
[facility] CM Inpatient Consult   09/19/2020  Omario W Ahart 09/03/1951 [id number]  [facility] [[facility]]  Accountable Care Organization [ACO] Patient: [employer] Medicare  Patient screened for showing in network with  [facility]  [[facility]] Care Management service.  Met with the patient at bedside and patient endorses Primary Care Provider as Dr. Frances Wong this provider is listed to provide the transition of care [TOC] for post hospital follow up.  Explained to patient if he receives General EMMI follow up calls and he may get a follow up from a [facility] RN Care Manager if he is having any issues.    Plan:  Gave patient a brochure and a 24 hour nurse advice line magnet explained.  Will alert TOC team of patient's primary care provider for update.  Patient verbalized understanding and denies any current needs.  For questions contact:   Victoria Brewer, RN BSN CCM [facility] Hospital Liaison  [phone] business mobile phone Toll free office [phone]  Fax number: [phone] [email or web address]       

## 2020-09-19 NOTE — Plan of Care (Signed)
  Problem: Activity: Goal: Capacity to carry out activities will improve Outcome: Progressing   Problem: Activity: Goal: Risk for activity intolerance will decrease Outcome: Progressing   Problem: Coping: Goal: Level of anxiety will decrease Outcome: Progressing   Problem: Pain Managment: Goal: General experience of comfort will improve Outcome: Progressing   Problem: Safety: Goal: Ability to remain free from injury will improve Outcome: Progressing

## 2020-09-20 DIAGNOSIS — R079 Chest pain, unspecified: Secondary | ICD-10-CM | POA: Diagnosis not present

## 2020-09-20 DIAGNOSIS — I2510 Atherosclerotic heart disease of native coronary artery without angina pectoris: Secondary | ICD-10-CM | POA: Diagnosis not present

## 2020-09-20 DIAGNOSIS — I160 Hypertensive urgency: Secondary | ICD-10-CM | POA: Diagnosis not present

## 2020-09-20 DIAGNOSIS — E785 Hyperlipidemia, unspecified: Secondary | ICD-10-CM | POA: Diagnosis not present

## 2020-09-20 MED ORDER — METFORMIN HCL 500 MG PO TABS: 500.0000 mg | ORAL_TABLET | Freq: Two times a day (BID) | ORAL | 11 refills | Status: AC

## 2020-09-20 MED ORDER — ROSUVASTATIN CALCIUM 40 MG PO TABS: 40.0000 mg | ORAL_TABLET | Freq: Every day | ORAL | 0 refills | Status: DC

## 2020-09-20 MED ORDER — CARVEDILOL 12.5 MG PO TABS: 12.5000 mg | ORAL_TABLET | Freq: Two times a day (BID) | ORAL | 0 refills | Status: DC

## 2020-09-20 MED ORDER — ASPIRIN 81 MG PO TBEC: 81.0000 mg | DELAYED_RELEASE_TABLET | Freq: Every day | ORAL | 11 refills | Status: DC

## 2020-09-20 NOTE — Progress Notes (Signed)
D/C instructions were given and reviewed. No questions asked but encouraged to call with any concerns. Tele and IV's removed, tolerated well.

## 2020-09-20 NOTE — Plan of Care (Signed)

## 2020-09-20 NOTE — Progress Notes (Addendum)
Progress Note  Patient Name: Justin Waters Date of Encounter: 09/20/2020  [facility] Cardiologist: Elouise Munroe, MD   Subjective   Feeling well. No chest pain, sob or palpitations.   Inpatient Medications    Scheduled Meds: . aspirin EC  81 mg Oral Daily  . carvedilol  12.5 mg Oral BID WC  . enoxaparin (LOVENOX) injection  40 mg Subcutaneous Q24H  . irbesartan  150 mg Oral Daily  . rosuvastatin  40 mg Oral Daily  . sodium chloride flush  3 mL Intravenous Q12H   Continuous Infusions: . sodium chloride     PRN Meds: sodium chloride, acetaminophen **OR** acetaminophen, nitroGLYCERIN, ondansetron **OR** ondansetron (ZOFRAN) IV, sodium chloride flush   Vital Signs    Vitals:   09/19/20 2030 09/20/20 0452 09/20/20 0455 09/20/20 0900  BP: (!) 141/72 104/66    Pulse: 67 69  71  Resp: 20 16    Temp: 98.5 F (36.9 C) 98.7 F (37.1 C)    TempSrc: Oral Oral    SpO2: 95% 95%    Weight:   97 kg   Height:        Intake/Output Summary (Last 24 hours) at 09/20/2020 1145 Last data filed at 09/20/2020 [id number] Gross per 24 hour  Intake 720 ml  Output --  Net 720 ml   Last 3 Weights 09/20/2020 09/19/2020 09/18/2020  Weight (lbs) 213 lb 14.4 oz 212 lb 4.9 oz 212 lb 8.4 oz  Weight (kg) 97.024 kg 96.3 kg 96.4 kg      Telemetry    NSR - Personally Reviewed  ECG   N/A  Physical Exam   GEN: No acute distress.   Neck: No JVD Cardiac: RRR, no murmurs, rubs, or gallops.  Respiratory: Clear to auscultation bilaterally. GI: Soft, nontender, non-distended  MS: No edema; No deformity. Neuro:  Nonfocal  Psych: Normal affect   Labs    High Sensitivity Troponin:   Recent Labs  Lab 09/17/20 1805 09/17/20 2015 09/18/20 0703 09/18/20 1012  TROPONINIHS 9 14 15 15       Chemistry Recent Labs  Lab 09/17/20 1805 09/19/20 0307  NA 137 139  K 3.7 4.2  CL 102 107  CO2 21* 20*  GLUCOSE 121* 115*  BUN 17 19  CREATININE 1.17 1.12  CALCIUM 9.8 9.2  PROT  --   6.8  ALBUMIN  --  3.7  AST  --  25  ALT  --  31  ALKPHOS  --  57  BILITOT  --  1.0  GFRNONAA >60 >60  ANIONGAP 14 12     Hematology Recent Labs  Lab 09/17/20 1805 09/19/20 0307  WBC 12.2* 7.5  RBC 5.61 5.23  HGB 16.6 15.6  HCT 49.4 46.7  MCV 88.1 89.3  MCH 29.6 29.8  MCHC 33.6 33.4  RDW 12.3 12.5  PLT 304 194    DDimer  Recent Labs  Lab 09/17/20 2058  DDIMER 0.42     Radiology    CT CORONARY MORPH W/CTA COR W/SCORE W/CA W/CM &/OR WO/CM  Addendum Date: 09/20/2020   ADDENDUM REPORT: 09/20/2020 10:21 HISTORY: Chest pain, nonspecific EXAM: Cardiac/Coronary  CT TECHNIQUE: The patient was scanned on a [employer]. PROTOCOL: A 120 kV prospective scan was triggered in the descending thoracic aorta at 111 HU's. Axial non-contrast 3 mm slices were carried out through the heart. The data set was analyzed on a dedicated work station and scored using the Agatston method. Gantry rotation speed was 250  msecs and collimation was .6 mm. Beta blockade and 0.8 mg of sl NTG was given. The 3D data set was reconstructed in 5% intervals of the 67-82 % of the R-R cycle. Diastolic phases were analyzed on a dedicated work station using MPR, MIP and VRT modes. The patient received 42mL OMNIPAQUE IOHEXOL 350 MG/ML SOLN of contrast. FINDINGS: Image quality: good Noise artifact is: Moderate, calcium blooming artifact. Coronary calcium score is 2111, which places the patient in the 95th percentile for age and sex matched control. Coronary arteries: Normal coronary origins.  Right dominance. Right Coronary Artery: Mild mixed atherosclerotic plaque in the proximal RCA, 25-49% stenosis. Serial moderate mixed atherosclerotic stenoses in the mid RCA, 50-69% stenosis. Mild mixed atherosclerotic plaque in the distal RCA, 25-49% stenosis. Left Main Coronary Artery: Minimal ostial left main mixed atherosclerotic plaque, <25% stenosis. Mild mixed atherosclerotic plaque in the distal left main coronary artery,  25-49% stenosis. Left Anterior Descending Coronary Artery: Wrap around LAD. Moderate proximal, mid, and distal LAD mixed atherosclerotic plaque, 50-69% stenosis. The portion of the distal LAD that wraps around the LV apex is diffusely diseased. Moderate mixed atherosclerotic plaque in the first diagonal artery, 50-69% stenosis. Left Circumflex Artery: Mild mixed atherosclerotic plaque in the proximal L circumflex artery, 25-49% stenosis. After the second OM, the distal L circumflex as two serial moderate mixed atherosclerotic plaques, 50-69% stenosis. Aorta: Normal size, 36 mm at the mid ascending aorta (level of the PA bifurcation) measured double oblique. Mild calcifications. No dissection. Aortic Valve: Moderate calcifications.  AV calcium score 748. Other findings: Normal pulmonary vein drainage into the left atrium. Normal left atrial appendage without a thrombus. Normal size of the pulmonary artery. Mild mitral annular calcifications. IMPRESSION: 1. Moderate CAD, CADRADS = 3. CT FFR will be performed and reported separately. 2. Coronary calcium score is 2111, which places the patient in the 95th percentile for age and sex matched control. 3. Normal coronary origin with right dominance. Electronically Signed   By: Cherlynn Kaiser   On: 09/20/2020 10:21   Result Date: 09/20/2020 EXAM: OVER-READ INTERPRETATION  CT CHEST The following report is an over-read performed by radiologist Dr. Zetta Bills of [facility], [place] on 09/19/2020. This over-read does not include interpretation of cardiac or coronary anatomy or pathology. The coronary calcium score/coronary CTA interpretation by the cardiologist is attached. COMPARISON:  None FINDINGS: Vascular: Calcified atheromatous plaque of the thoracic aorta and coronary artery calcifications, see dedicated CT angiography report and coronary calcium scoring. Mediastinum/Nodes: Limited visualization of mediastinal structures without signs of adenopathy.  Visualized esophagus is grossly normal. No hilar adenopathy within the visualized portions of the hila bilaterally. Lungs/Pleura: Moderate pulmonary emphysema. No consolidation. No pleural effusion. Lungs are incompletely imaged. Small RIGHT upper lobe pulmonary nodule (image 17, series 12) 3 mm. Small RIGHT middle lobe pulmonary nodule in the RIGHT costodiaphragmatic recess 4 mm (image 33, series 12) Upper Abdomen: No acute upper abdominal process. Musculoskeletal: No acute bone finding or destructive bone process. Spinal degenerative changes. IMPRESSION: 1. Moderate pulmonary emphysema. 2. Small pulmonary nodules in the RIGHT chest as described largest 4 mm. Non-contrast chest CT can be considered in 12 months in this high risk patient. This recommendation follows the consensus statement: Guidelines for Management of Incidental Pulmonary Nodules Detected on CT Images: From the Fleischner Society 2017; Radiology 2017; 284:228-243. Is 3. Calcified atheromatous plaque of the thoracic aorta and coronary artery calcifications, see dedicated CT angiography report and coronary calcium scoring. Aortic Atherosclerosis (ICD10-I70.0) and Emphysema (ICD10-J43.9). Electronically  Signed: By: Zetta Bills M.D. On: 09/19/2020 09:45   CT CORONARY FRACTIONAL FLOW RESERVE FLUID ANALYSIS  Result Date: 09/20/2020 EXAM: CT FFR ANALYSIS CLINICAL DATA:  Chest pain, nonspecific FINDINGS: FFRct analysis was performed on the original cardiac CT angiogram dataset. Diagrammatic representation of the FFRct analysis is provided in a separate PDF document in PACS. This dictation was created using the PDF document and an interactive 3D model of the results. 3D model is not available in the EMR/PACS. Normal FFR range is >0.80. Indeterminate (grey) zone is 0.76-0.80. 1. Left Main: FFR = 0.97 2. LAD: Proximal FFR = 0.87, Mid FFR = 0.82, Distal FFR = 0.72 3. LCX: Proximal FFR = 0.91, Distal FFR = 0.86 4. RCA: Proximal FFR = 0.97, Mid FFR  =0.95, Distal FFR = 0.88 IMPRESSION: 1. CT FFR analysis showed a hemodynamically significant FFR value in the distal LAD. This is likely due to diffuse disease in the small caliber distal LAD. RECOMMENDATIONS: Guideline-directed medical therapy and aggressive risk factor modification for secondary prevention of coronary artery disease. Electronically Signed   By: Cherlynn Kaiser   On: 09/20/2020 10:31   ECHOCARDIOGRAM COMPLETE  Result Date: 09/18/2020    ECHOCARDIOGRAM REPORT   Patient Name:   Justin Waters [facility] Date of Exam: 09/18/2020 Medical Rec #:  [id number]      Height:       71.0 in Accession #:    [id number]     Weight:       212.5 lb Date of Birth:  01/28/1951     BSA:          2.164 m Patient Age:    45 years       BP:           173/94 mmHg Patient Gender: M              HR:           81 bpm. Exam Location:  Inpatient Procedure: 2D Echo Indications:    abnormal ecg  History:        Patient has no prior history of Echocardiogram examinations.  Sonographer:    Johny Chess Referring Phys: [place]  1. Left ventricular ejection fraction, by estimation, is 65 to 70%. The left ventricle has normal function. The left ventricle has no regional wall motion abnormalities. There is mild left ventricular hypertrophy. Left ventricular diastolic parameters are consistent with Grade I diastolic dysfunction (impaired relaxation).  2. Right ventricular systolic function is normal. The right ventricular size is normal.  3. The mitral valve is abnormal. Mild mitral valve regurgitation. Moderate to severe mitral annular calcification.  4. The aortic valve is tricuspid. Aortic valve regurgitation is trivial. Mild aortic valve sclerosis is present, with no evidence of aortic valve stenosis.  5. The inferior vena cava is normal in size with greater than 50% respiratory variability, suggesting right atrial pressure of 3 mmHg. Comparison(s): No prior Echocardiogram. FINDINGS  Left Ventricle: Left  ventricular ejection fraction, by estimation, is 65 to 70%. The left ventricle has normal function. The left ventricle has no regional wall motion abnormalities. Definity contrast agent was given IV to delineate the left ventricular  endocardial borders. The left ventricular internal cavity size was normal in size. There is mild left ventricular hypertrophy. Left ventricular diastolic parameters are consistent with Grade I diastolic dysfunction (impaired relaxation). Indeterminate filling pressures. Right Ventricle: The right ventricular size is normal. No increase in right ventricular wall thickness. Right ventricular  systolic function is normal. Left Atrium: Left atrial size was normal in size. Right Atrium: Right atrial size was normal in size. Pericardium: There is no evidence of pericardial effusion. Mitral Valve: The mitral valve is abnormal. There is moderate calcification of the posterior mitral valve leaflet(s). Moderate to severe mitral annular calcification. Mild mitral valve regurgitation. MV peak gradient, 5.7 mmHg. The mean mitral valve gradient is 3.0 mmHg. Tricuspid Valve: The tricuspid valve is grossly normal. Tricuspid valve regurgitation is trivial. Aortic Valve: The aortic valve is tricuspid. Aortic valve regurgitation is trivial. Mild aortic valve sclerosis is present, with no evidence of aortic valve stenosis. Pulmonic Valve: The pulmonic valve was grossly normal. Pulmonic valve regurgitation is trivial. Aorta: The aortic root and ascending aorta are structurally normal, with no evidence of dilitation. Venous: The inferior vena cava is normal in size with greater than 50% respiratory variability, suggesting right atrial pressure of 3 mmHg. IAS/Shunts: No atrial level shunt detected by color flow Doppler.  LEFT VENTRICLE PLAX 2D LVIDd:         3.40 cm  Diastology LVIDs:         2.00 cm  LV e' medial:    6.31 cm/s LV PW:         1.10 cm  LV E/e' medial:  14.3 LV IVS:        1.10 cm  LV e'  lateral:   7.94 cm/s LVOT diam:     2.10 cm  LV E/e' lateral: 11.3 LV SV:         80 LV SV Index:   37 LVOT Area:     3.46 cm  RIGHT VENTRICLE             IVC RV S prime:     12.90 cm/s  IVC diam: 1.70 cm TAPSE (M-mode): 1.4 cm LEFT ATRIUM             Index       RIGHT ATRIUM           Index LA diam:        4.00 cm 1.85 cm/m  RA Area:     13.20 cm LA Vol (A2C):   58.1 ml 26.85 ml/m RA Volume:   30.50 ml  14.10 ml/m LA Vol (A4C):   46.9 ml 21.68 ml/m LA Biplane Vol: 52.5 ml 24.26 ml/m  AORTIC VALVE LVOT Vmax:   110.00 cm/s LVOT Vmean:  73.500 cm/s LVOT VTI:    0.231 m  AORTA Ao Root diam: 3.50 cm Ao Asc diam:  3.50 cm MITRAL VALVE MV Area (PHT): 2.50 cm     SHUNTS MV Peak grad:  5.7 mmHg     Systemic VTI:  0.23 m MV Mean grad:  3.0 mmHg     Systemic Diam: 2.10 cm MV Vmax:       1.19 m/s MV Vmean:      73.9 cm/s MV Decel Time: 303 msec MV E velocity: 90.00 cm/s MV A velocity: 132.00 cm/s MV E/A ratio:  0.68 Zoila ShutterKenneth Hilty MD Electronically signed by Zoila ShutterKenneth Hilty MD Signature Date/Time: 09/18/2020/5:24:21 PM    Final     Cardiac Studies   Coronary CTA 09/20/2020 IMPRESSION: 1. CT FFR analysis showed a hemodynamically significant FFR value in the distal LAD. This is likely due to diffuse disease in the small caliber distal LAD.  RECOMMENDATIONS: Guideline-directed medical therapy and aggressive risk factor modification for secondary prevention of coronary artery disease.  IMPRESSION: 1. Moderate CAD, CADRADS =  3. CT FFR will be performed and reported separately.  2. Coronary calcium score is 2111, which places the patient in the 95th percentile for age and sex matched control.  3. Normal coronary origin with right dominance.  Patient Profile     69 y.o. male a hx of HTN, prediabetes, hyperlipidemia, 30 pk year smoking historypresented for chest pain evaluation in setting of HTN urgency.   Assessment & Plan    1. Chest pain/CAD - Some pleuritic symptoms but not classing. Normal  troponin, CRP, sed rate and d-dimer.  - Echo with preserved LVEF without WM abnormality - Likely his symptoms due to hypertensive urgency  - Coronary CT with moderate CAD and flow limiting dLAD stenosis>> medical therapy recommended  - Continue ASA, statin and BB  2. Hypertensive urgency -BP normalized on Coreg 12.5mg  amd Irbesartan 150mg  qd  3. HLD - 09/19/2020: Cholesterol 270; HDL 33; LDL Cholesterol 201; Triglycerides 179; VLDL 36  - Added Crestor 40mg  qd - Lipid panel and LFTS in 6-8 weeks - LDL goal is less than 70  For questions or updates, please contact [place] Please consult [email or web address] for contact info under     SignedLeanor Kail, PA  09/20/2020, 11:45 AM    Patient seen and examined with [facility].  Agree as above, with the following exceptions and changes as noted below.  He feels well without chest pain or shortness of breath.  Blood pressure and heart rate are well controlled at this time on regimen noted above.  Reviewed with Dr. Wynelle Cleveland prior to dismissal planning.  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  He has been started on Metformin and I agree.  We have increased his Crestor to 40 mg daily, and he should continue on aspirin, carvedilol, and irbesartan.  We have discussed risk factor modification in detail.  I have arranged follow-up in my clinic.  Gen: NAD, CV: RRR, no murmurs, Lungs: clear, Abd: soft, Extrem: Warm, well perfused, no edema, Neuro/Psych: alert and oriented x 3, normal mood and affect. All available labs, radiology testing, previous records reviewed.  Plan for continued aggressive outpatient management of moderate CAD without hemodynamically significant lesions by CT FFR.  Continue management of hypertension,  hyperlipidemia, diabetes as an outpatient.  I recommend a moderate exercise program and the Mediterranean diet.  Elouise Munroe, MD 09/20/20 1:14 PM

## 2020-09-20 NOTE — Plan of Care (Signed)
  Problem: Education: Goal: Ability to demonstrate management of disease process will improve Outcome: Progressing Goal: Ability to verbalize understanding of medication therapies will improve Outcome: Progressing   Problem: Education: Goal: Knowledge of General Education information will improve Description: Including pain rating scale, medication(s)/side effects and non-pharmacologic comfort measures Outcome: Progressing   Problem: Health Behavior/Discharge Planning: Goal: Ability to manage health-related needs will improve Outcome: Progressing   Problem: Clinical Measurements: Goal: Cardiovascular complication will be avoided Outcome: Progressing

## 2020-09-20 NOTE — Discharge Summary (Addendum)
Physician Discharge Summary  Justin Waters [id number] DOB: 06/12/1951 DOA: 09/17/2020  PCP: Patient, No Pcp Per  Admit date: 09/17/2020 Discharge date: 09/20/2020  Admitted From: home Disposition:  home   Recommendations for Outpatient Follow-up:  1. Cont to follow BP   Discharge Condition:  stable   CODE STATUS:  Full code   Diet recommendation:  Heart healthy Consultations:  none     Discharge Diagnoses:  Principal Problem:   Chest pain Active Problems:   GERD (gastroesophageal reflux disease)   Hypertensive urgency   Pre-diabetes     Brief Summary: Justin Waters is an 69 y.o. male past medical history significant for essential hypertension comes into the hospital for chest pain and elevated blood pressure he was recently started on Avapro.  In the ED he was found to be tachycardic with a blood pressure in the 170s satting on room air cardiac biomarkers negative x3 SARS-CoV-2 PCR negative, D-dimer unremarkable.  Hospital Course:  Hypertensive urgency: He was continue on his Avapro, started on Coreg in house. Cardiac biomarkers have remained negative till date. 2D echo showed an EF of 123456 grade 1 diastolic heart failure no wall motion abnormality. CT coronaries shows moderate CAD- recommending medical management     Chest pain 2D echo was done that showed an EF of 123456 grade 1 diastolic heart failure and severe mitral annular calcification. Cardiology was consulted who recommended a CTA which is pending at the time of this dictation.  Progressive generalized fatigue/shortness of breath: Cardiology has been consulted to have, they have ordered a CTA. Cardiac biomarkers have remained negative x2, CRP 0.8.  Pre-diabetes A1c is 6.4- recommend diet control and Metformin- discussed with patient and prescribed Metformin  Hyperlipidemia: HDL less than 40 LDL greater than 130 Ahead and start him on Crestor.   Discharge Exam: Vitals:   09/20/20 0452 09/20/20  0900  BP: 104/66   Pulse: 69 71  Resp: 16   Temp: 98.7 F (37.1 C)   SpO2: 95%    Vitals:   09/19/20 2030 09/20/20 0452 09/20/20 0455 09/20/20 0900  BP: (!) 141/72 104/66    Pulse: 67 69  71  Resp: 20 16    Temp: 98.5 F (36.9 C) 98.7 F (37.1 C)    TempSrc: Oral Oral    SpO2: 95% 95%    Weight:   97 kg   Height:        General: Pt is alert, awake, not in acute distress Cardiovascular: RRR, S1/S2 +, no rubs, no gallops Respiratory: CTA bilaterally, no wheezing, no rhonchi Abdominal: Soft, NT, ND, bowel sounds + Extremities: no edema, no cyanosis   Discharge Instructions  Discharge Instructions    Diet - low sodium heart healthy   Complete by: As directed    Diet Carb Modified   Complete by: As directed    Increase activity slowly   Complete by: As directed      Allergies as of 09/20/2020   No Known Allergies     Medication List    STOP taking these medications   chlorpheniramine 4 MG tablet Commonly known as: CHLOR-TRIMETON   ibuprofen 200 MG tablet Commonly known as: ADVIL     TAKE these medications   carvedilol 12.5 MG tablet Commonly known as: COREG Take 1 tablet (12.5 mg total) by mouth 2 (two) times daily with a meal.   irbesartan 150 MG tablet Commonly known as: AVAPRO Take 150 mg by mouth daily.   metFORMIN 500 MG tablet  Commonly known as: Glucophage Take 1 tablet (500 mg total) by mouth 2 (two) times daily with a meal.   multivitamin capsule Take 1 capsule by mouth daily.   PROBIOTIC-10 PO Take 1 tablet by mouth daily.   rosuvastatin 40 MG tablet Commonly known as: CRESTOR Take 1 tablet (40 mg total) by mouth daily. Start taking on: September 21, 2020       Follow-up Information    Elouise Munroe, MD. Go on 10/19/2020.   Specialties: Cardiology, Radiology Why: @10 :20am for hospital follow up  Contact information: [address] [place] [address] [phone]              No Known  Allergies    DG Chest 2 View  Result Date: 09/17/2020 CLINICAL DATA:  Left-sided chest pain, short of breath, dizziness EXAM: CHEST - 2 VIEW COMPARISON:  06/20/2017 FINDINGS: Frontal and lateral views of the chest demonstrate an unremarkable cardiac silhouette. No airspace disease, effusion, or pneumothorax. No acute bony abnormalities. IMPRESSION: 1. No acute intrathoracic process. Electronically Signed   By: Randa Ngo M.D.   On: 09/17/2020 20:49   CT CORONARY MORPH W/CTA COR W/SCORE W/CA W/CM &/OR WO/CM  Addendum Date: 09/20/2020   ADDENDUM REPORT: 09/20/2020 10:21 HISTORY: Chest pain, nonspecific EXAM: Cardiac/Coronary  CT TECHNIQUE: The patient was scanned on a [employer]. PROTOCOL: A 120 kV prospective scan was triggered in the descending thoracic aorta at 111 HU's. Axial non-contrast 3 mm slices were carried out through the heart. The data set was analyzed on a dedicated work station and scored using the Agatston method. Gantry rotation speed was 250 msecs and collimation was .6 mm. Beta blockade and 0.8 mg of sl NTG was given. The 3D data set was reconstructed in 5% intervals of the 67-82 % of the R-R cycle. Diastolic phases were analyzed on a dedicated work station using MPR, MIP and VRT modes. The patient received 32mL OMNIPAQUE IOHEXOL 350 MG/ML SOLN of contrast. FINDINGS: Image quality: good Noise artifact is: Moderate, calcium blooming artifact. Coronary calcium score is 2111, which places the patient in the 95th percentile for age and sex matched control. Coronary arteries: Normal coronary origins.  Right dominance. Right Coronary Artery: Mild mixed atherosclerotic plaque in the proximal RCA, 25-49% stenosis. Serial moderate mixed atherosclerotic stenoses in the mid RCA, 50-69% stenosis. Mild mixed atherosclerotic plaque in the distal RCA, 25-49% stenosis. Left Main Coronary Artery: Minimal ostial left main mixed atherosclerotic plaque, <25% stenosis. Mild mixed  atherosclerotic plaque in the distal left main coronary artery, 25-49% stenosis. Left Anterior Descending Coronary Artery: Wrap around LAD. Moderate proximal, mid, and distal LAD mixed atherosclerotic plaque, 50-69% stenosis. The portion of the distal LAD that wraps around the LV apex is diffusely diseased. Moderate mixed atherosclerotic plaque in the first diagonal artery, 50-69% stenosis. Left Circumflex Artery: Mild mixed atherosclerotic plaque in the proximal L circumflex artery, 25-49% stenosis. After the second OM, the distal L circumflex as two serial moderate mixed atherosclerotic plaques, 50-69% stenosis. Aorta: Normal size, 36 mm at the mid ascending aorta (level of the PA bifurcation) measured double oblique. Mild calcifications. No dissection. Aortic Valve: Moderate calcifications.  AV calcium score 748. Other findings: Normal pulmonary vein drainage into the left atrium. Normal left atrial appendage without a thrombus. Normal size of the pulmonary artery. Mild mitral annular calcifications. IMPRESSION: 1. Moderate CAD, CADRADS = 3. CT FFR will be performed and reported separately. 2. Coronary calcium score is 2111, which places the patient  in the 95th percentile for age and sex matched control. 3. Normal coronary origin with right dominance. Electronically Signed   By: Cherlynn Kaiser   On: 09/20/2020 10:21   Result Date: 09/20/2020 EXAM: OVER-READ INTERPRETATION  CT CHEST The following report is an over-read performed by radiologist Dr. Zetta Bills of [facility] [place] on 09/19/2020. This over-read does not include interpretation of cardiac or coronary anatomy or pathology. The coronary calcium score/coronary CTA interpretation by the cardiologist is attached. COMPARISON:  None FINDINGS: Vascular: Calcified atheromatous plaque of the thoracic aorta and coronary artery calcifications, see dedicated CT angiography report and coronary calcium scoring. Mediastinum/Nodes: Limited visualization  of mediastinal structures without signs of adenopathy. Visualized esophagus is grossly normal. No hilar adenopathy within the visualized portions of the hila bilaterally. Lungs/Pleura: Moderate pulmonary emphysema. No consolidation. No pleural effusion. Lungs are incompletely imaged. Small RIGHT upper lobe pulmonary nodule (image 17, series 12) 3 mm. Small RIGHT middle lobe pulmonary nodule in the RIGHT costodiaphragmatic recess 4 mm (image 33, series 12) Upper Abdomen: No acute upper abdominal process. Musculoskeletal: No acute bone finding or destructive bone process. Spinal degenerative changes. IMPRESSION: 1. Moderate pulmonary emphysema. 2. Small pulmonary nodules in the RIGHT chest as described largest 4 mm. Non-contrast chest CT can be considered in 12 months in this high risk patient. This recommendation follows the consensus statement: Guidelines for Management of Incidental Pulmonary Nodules Detected on CT Images: From the Fleischner Society 2017; Radiology 2017; 284:228-243. Is 3. Calcified atheromatous plaque of the thoracic aorta and coronary artery calcifications, see dedicated CT angiography report and coronary calcium scoring. Aortic Atherosclerosis (ICD10-I70.0) and Emphysema (ICD10-J43.9). Electronically Signed: By: Zetta Bills M.D. On: 09/19/2020 09:45   CT CORONARY FRACTIONAL FLOW RESERVE FLUID ANALYSIS  Result Date: 09/20/2020 EXAM: CT FFR ANALYSIS CLINICAL DATA:  Chest pain, nonspecific FINDINGS: FFRct analysis was performed on the original cardiac CT angiogram dataset. Diagrammatic representation of the FFRct analysis is provided in a separate PDF document in PACS. This dictation was created using the PDF document and an interactive 3D model of the results. 3D model is not available in the EMR/PACS. Normal FFR range is >0.80. Indeterminate (grey) zone is 0.76-0.80. 1. Left Main: FFR = 0.97 2. LAD: Proximal FFR = 0.87, Mid FFR = 0.82, Distal FFR = 0.72 3. LCX: Proximal FFR = 0.91,  Distal FFR = 0.86 4. RCA: Proximal FFR = 0.97, Mid FFR =0.95, Distal FFR = 0.88 IMPRESSION: 1. CT FFR analysis showed a hemodynamically significant FFR value in the distal LAD. This is likely due to diffuse disease in the small caliber distal LAD. RECOMMENDATIONS: Guideline-directed medical therapy and aggressive risk factor modification for secondary prevention of coronary artery disease. Electronically Signed   By: Cherlynn Kaiser   On: 09/20/2020 10:31   ECHOCARDIOGRAM COMPLETE  Result Date: 09/18/2020    ECHOCARDIOGRAM REPORT   Patient Name:   Justin Waters [facility] Date of Exam: 09/18/2020 Medical Rec #:  [id number]      Height:       71.0 in Accession #:    [id number]     Weight:       212.5 lb Date of Birth:  May 07, 1951     BSA:          2.164 m Patient Age:    69 years       BP:           173/94 mmHg Patient Gender: M  HR:           81 bpm. Exam Location:  Inpatient Procedure: 2D Echo Indications:    abnormal ecg  History:        Patient has no prior history of Echocardiogram examinations.  Sonographer:    Johny Chess Referring Phys: [place]  1. Left ventricular ejection fraction, by estimation, is 65 to 70%. The left ventricle has normal function. The left ventricle has no regional wall motion abnormalities. There is mild left ventricular hypertrophy. Left ventricular diastolic parameters are consistent with Grade I diastolic dysfunction (impaired relaxation).  2. Right ventricular systolic function is normal. The right ventricular size is normal.  3. The mitral valve is abnormal. Mild mitral valve regurgitation. Moderate to severe mitral annular calcification.  4. The aortic valve is tricuspid. Aortic valve regurgitation is trivial. Mild aortic valve sclerosis is present, with no evidence of aortic valve stenosis.  5. The inferior vena cava is normal in size with greater than 50% respiratory variability, suggesting right atrial pressure of 3 mmHg. Comparison(s): No  prior Echocardiogram. FINDINGS  Left Ventricle: Left ventricular ejection fraction, by estimation, is 65 to 70%. The left ventricle has normal function. The left ventricle has no regional wall motion abnormalities. Definity contrast agent was given IV to delineate the left ventricular  endocardial borders. The left ventricular internal cavity size was normal in size. There is mild left ventricular hypertrophy. Left ventricular diastolic parameters are consistent with Grade I diastolic dysfunction (impaired relaxation). Indeterminate filling pressures. Right Ventricle: The right ventricular size is normal. No increase in right ventricular wall thickness. Right ventricular systolic function is normal. Left Atrium: Left atrial size was normal in size. Right Atrium: Right atrial size was normal in size. Pericardium: There is no evidence of pericardial effusion. Mitral Valve: The mitral valve is abnormal. There is moderate calcification of the posterior mitral valve leaflet(s). Moderate to severe mitral annular calcification. Mild mitral valve regurgitation. MV peak gradient, 5.7 mmHg. The mean mitral valve gradient is 3.0 mmHg. Tricuspid Valve: The tricuspid valve is grossly normal. Tricuspid valve regurgitation is trivial. Aortic Valve: The aortic valve is tricuspid. Aortic valve regurgitation is trivial. Mild aortic valve sclerosis is present, with no evidence of aortic valve stenosis. Pulmonic Valve: The pulmonic valve was grossly normal. Pulmonic valve regurgitation is trivial. Aorta: The aortic root and ascending aorta are structurally normal, with no evidence of dilitation. Venous: The inferior vena cava is normal in size with greater than 50% respiratory variability, suggesting right atrial pressure of 3 mmHg. IAS/Shunts: No atrial level shunt detected by color flow Doppler.  LEFT VENTRICLE PLAX 2D LVIDd:         3.40 cm  Diastology LVIDs:         2.00 cm  LV e' medial:    6.31 cm/s LV PW:         1.10 cm  LV  E/e' medial:  14.3 LV IVS:        1.10 cm  LV e' lateral:   7.94 cm/s LVOT diam:     2.10 cm  LV E/e' lateral: 11.3 LV SV:         80 LV SV Index:   37 LVOT Area:     3.46 cm  RIGHT VENTRICLE             IVC RV S prime:     12.90 cm/s  IVC diam: 1.70 cm TAPSE (M-mode): 1.4 cm LEFT ATRIUM  Index       RIGHT ATRIUM           Index LA diam:        4.00 cm 1.85 cm/m  RA Area:     13.20 cm LA Vol (A2C):   58.1 ml 26.85 ml/m RA Volume:   30.50 ml  14.10 ml/m LA Vol (A4C):   46.9 ml 21.68 ml/m LA Biplane Vol: 52.5 ml 24.26 ml/m  AORTIC VALVE LVOT Vmax:   110.00 cm/s LVOT Vmean:  73.500 cm/s LVOT VTI:    0.231 m  AORTA Ao Root diam: 3.50 cm Ao Asc diam:  3.50 cm MITRAL VALVE MV Area (PHT): 2.50 cm     SHUNTS MV Peak grad:  5.7 mmHg     Systemic VTI:  0.23 m MV Mean grad:  3.0 mmHg     Systemic Diam: 2.10 cm MV Vmax:       1.19 m/s MV Vmean:      73.9 cm/s MV Decel Time: 303 msec MV E velocity: 90.00 cm/s MV A velocity: 132.00 cm/s MV E/A ratio:  0.68 Lyman Bishop MD Electronically signed by Lyman Bishop MD Signature Date/Time: 09/18/2020/5:24:21 PM    Final       The results of significant diagnostics from this hospitalization (including imaging, microbiology, ancillary and laboratory) are listed below for reference.     Microbiology: Recent Results (from the past 240 hour(s))  Resp Panel by RT-PCR (Flu A&B, Covid) Nasopharyngeal Swab     Status: None   Collection Time: 09/17/20 10:00 PM   Specimen: Nasopharyngeal Swab; Nasopharyngeal(NP) swabs in vial transport medium  Result Value Ref Range Status   SARS Coronavirus 2 by RT PCR NEGATIVE NEGATIVE Final    Comment: (NOTE) SARS-CoV-2 target nucleic acids are NOT DETECTED.  The SARS-CoV-2 RNA is generally detectable in upper respiratory specimens during the acute phase of infection. The lowest concentration of SARS-CoV-2 viral copies this assay can detect is 138 copies/mL. A negative result does not preclude SARS-Cov-2 infection and  should not be used as the sole basis for treatment or other patient management decisions. A negative result may occur with  improper specimen collection/handling, submission of specimen other than nasopharyngeal swab, presence of viral mutation(s) within the areas targeted by this assay, and inadequate number of viral copies(<138 copies/mL). A negative result must be combined with clinical observations, patient history, and epidemiological information. The expected result is Negative.  Fact Sheet for Patients:  EntrepreneurPulse.com.au  Fact Sheet for Healthcare Providers:  IncredibleEmployment.be  This test is no t yet approved or cleared by the [place] FDA and  has been authorized for detection and/or diagnosis of SARS-CoV-2 by FDA under an Emergency Use Authorization (EUA). This EUA will remain  in effect (meaning this test can be used) for the duration of the COVID-19 declaration under Section 564(b)(1) of the Act, 21 U.S.C.section 360bbb-3(b)(1), unless the authorization is terminated  or revoked sooner.       Influenza A by PCR NEGATIVE NEGATIVE Final   Influenza B by PCR NEGATIVE NEGATIVE Final    Comment: (NOTE) The Xpert Xpress SARS-CoV-2/FLU/RSV plus assay is intended as an aid in the diagnosis of influenza from Nasopharyngeal swab specimens and should not be used as a sole basis for treatment. Nasal washings and aspirates are unacceptable for Xpert Xpress SARS-CoV-2/FLU/RSV testing.  Fact Sheet for Patients: EntrepreneurPulse.com.au  Fact Sheet for Healthcare Providers: IncredibleEmployment.be  This test is not yet approved or cleared by the [place] FDA  and has been authorized for detection and/or diagnosis of SARS-CoV-2 by FDA under an Emergency Use Authorization (EUA). This EUA will remain in effect (meaning this test can be used) for the duration of the COVID-19 declaration  under Section 564(b)(1) of the Act, 21 U.S.C. section 360bbb-3(b)(1), unless the authorization is terminated or revoked.  Performed at [facility], [address] [place], [place] [address]      Labs: BNP (last 3 results) No results for input(s): BNP in the last 8760 hours. Basic Metabolic Panel: Recent Labs  Lab 09/17/20 1805 09/19/20 0307  NA 137 139  K 3.7 4.2  CL 102 107  CO2 21* 20*  GLUCOSE 121* 115*  BUN 17 19  CREATININE 1.17 1.12  CALCIUM 9.8 9.2   Liver Function Tests: Recent Labs  Lab 09/19/20 0307  AST 25  ALT 31  ALKPHOS 57  BILITOT 1.0  PROT 6.8  ALBUMIN 3.7   No results for input(s): LIPASE, AMYLASE in the last 168 hours. No results for input(s): AMMONIA in the last 168 hours. CBC: Recent Labs  Lab 09/17/20 1805 09/19/20 0307  WBC 12.2* 7.5  HGB 16.6 15.6  HCT 49.4 46.7  MCV 88.1 89.3  PLT 304 194   Cardiac Enzymes: No results for input(s): CKTOTAL, CKMB, CKMBINDEX, TROPONINI in the last 168 hours. BNP: Invalid input(s): POCBNP CBG: No results for input(s): GLUCAP in the last 168 hours. D-Dimer Recent Labs    09/17/20 2058  DDIMER 0.42   Hgb A1c Recent Labs    09/19/20 0307  HGBA1C 6.4*   Lipid Profile Recent Labs    09/19/20 0307  CHOL 270*  HDL 33*  LDLCALC 201*  TRIG 179*  CHOLHDL 8.2   Thyroid function studies No results for input(s): TSH, T4TOTAL, T3FREE, THYROIDAB in the last 72 hours.  Invalid input(s): FREET3 Anemia work up No results for input(s): VITAMINB12, FOLATE, FERRITIN, TIBC, IRON, RETICCTPCT in the last 72 hours. Urinalysis    Component Value Date/Time   BILIRUBINUR neg 02/17/2013 1853   PROTEINUR neg 02/17/2013 1853   UROBILINOGEN 0.2 02/17/2013 1853   NITRITE neg 02/17/2013 1853   LEUKOCYTESUR Negative 02/17/2013 1853   Sepsis Labs Invalid input(s): PROCALCITONIN,  WBC,  LACTICIDVEN Microbiology Recent Results (from the past 240 hour(s))  Resp Panel by RT-PCR (Flu A&B,  Covid) Nasopharyngeal Swab     Status: None   Collection Time: 09/17/20 10:00 PM   Specimen: Nasopharyngeal Swab; Nasopharyngeal(NP) swabs in vial transport medium  Result Value Ref Range Status   SARS Coronavirus 2 by RT PCR NEGATIVE NEGATIVE Final    Comment: (NOTE) SARS-CoV-2 target nucleic acids are NOT DETECTED.  The SARS-CoV-2 RNA is generally detectable in upper respiratory specimens during the acute phase of infection. The lowest concentration of SARS-CoV-2 viral copies this assay can detect is 138 copies/mL. A negative result does not preclude SARS-Cov-2 infection and should not be used as the sole basis for treatment or other patient management decisions. A negative result may occur with  improper specimen collection/handling, submission of specimen other than nasopharyngeal swab, presence of viral mutation(s) within the areas targeted by this assay, and inadequate number of viral copies(<138 copies/mL). A negative result must be combined with clinical observations, patient history, and epidemiological information. The expected result is Negative.  Fact Sheet for Patients:  BloggerCourse.com  Fact Sheet for Healthcare Providers:  SeriousBroker.it  This test is no t yet approved or cleared by the [place] FDA and  has been  authorized for detection and/or diagnosis of SARS-CoV-2 by FDA under an Emergency Use Authorization (EUA). This EUA will remain  in effect (meaning this test can be used) for the duration of the COVID-19 declaration under Section 564(b)(1) of the Act, 21 U.S.C.section 360bbb-3(b)(1), unless the authorization is terminated  or revoked sooner.       Influenza A by PCR NEGATIVE NEGATIVE Final   Influenza B by PCR NEGATIVE NEGATIVE Final    Comment: (NOTE) The Xpert Xpress SARS-CoV-2/FLU/RSV plus assay is intended as an aid in the diagnosis of influenza from Nasopharyngeal swab specimens and should  not be used as a sole basis for treatment. Nasal washings and aspirates are unacceptable for Xpert Xpress SARS-CoV-2/FLU/RSV testing.  Fact Sheet for Patients: EntrepreneurPulse.com.au  Fact Sheet for Healthcare Providers: IncredibleEmployment.be  This test is not yet approved or cleared by the [place] FDA and has been authorized for detection and/or diagnosis of SARS-CoV-2 by FDA under an Emergency Use Authorization (EUA). This EUA will remain in effect (meaning this test can be used) for the duration of the COVID-19 declaration under Section 564(b)(1) of the Act, 21 U.S.C. section 360bbb-3(b)(1), unless the authorization is terminated or revoked.  Performed at [facility], [address] [place], [place] [address]      Time coordinating discharge in minutes: 65  SIGNED:   Debbe Odea, MD  [facility] 09/20/2020, 11:12 AM

## 2020-10-03 ENCOUNTER — Other Ambulatory Visit: Payer: Self-pay | Admitting: *Deleted

## 2020-10-03 ENCOUNTER — Encounter: Payer: Self-pay | Admitting: *Deleted

## 2020-10-03 NOTE — Patient Outreach (Signed)
[facility]) Care Management  10/03/2020  [facility] 16-Oct-1950 [id number]  Initial telephone outreach attempted, Unsuccessful, left message and requested a return call.  Mr. Govan was referred by Dr. Modesto Charon after pt's recent hospitalization 12/19-12/22/2022 for CHEST PAIN. He also has a hx of GERD, HTN, Obesity. MI was ruled out. His dx studies inpt revealed EF of 65% grade I diastolic HF and severe mitral annular calcification. His HDL was low and LDL was > 425. He was stated on Crestor. He was advised to stop Chlorpheniramine and ibuprofen.  He was recently dx with HTN and was started on ibesartan about 1 month ago. Also started on metformin for HgbA1C 6.4. Note his Creatinine was high normal, GFR >60.  He is to see his cardiologist on 10/19/2020 for hospital follow up.  Patient was recently discharged from hospital and all medications have been reviewed.  Zara Council. Burgess Estelle, MSN, [facility] Gerontological Nurse Practitioner [facility] Care Management [phone]

## 2020-10-05 ENCOUNTER — Telehealth: Payer: Self-pay | Admitting: Internal Medicine

## 2020-10-05 DIAGNOSIS — E7800 Pure hypercholesterolemia, unspecified: Secondary | ICD-10-CM | POA: Diagnosis not present

## 2020-10-05 DIAGNOSIS — R0789 Other chest pain: Secondary | ICD-10-CM | POA: Diagnosis not present

## 2020-10-05 DIAGNOSIS — I10 Essential (primary) hypertension: Secondary | ICD-10-CM | POA: Diagnosis not present

## 2020-10-05 DIAGNOSIS — R9389 Abnormal findings on diagnostic imaging of other specified body structures: Secondary | ICD-10-CM | POA: Diagnosis not present

## 2020-10-05 NOTE — Telephone Encounter (Signed)
He will definitely need to be seen before surgery, we cannot comment on CV optimization without a visit since his BP was quite high in hospital.  Please move up his appt to any available APP next available. If no availability, please move his appt to 1/19 with me.  Thanks.

## 2020-10-05 NOTE — Telephone Encounter (Signed)
Spoke with patient, patient made aware of Dr. Lupe Carney recommendations. Made patient an appointment with Corine Shelter PA-C for Tuesday January the 11th at 9:15am. Patient verbalized understanding of all instructions.

## 2020-10-05 NOTE — Telephone Encounter (Signed)
New Message:     Pt says he is scheduled for Hernia Repair on 10-20-20. He wants to know if Dr Jacques Navy thinks he should postpone this surgery at this time? He is scheduled to see her on 10-19-20, but his surgery is the next day.

## 2020-10-05 NOTE — Telephone Encounter (Signed)
Pt called stating he has an appointment scheduled with Dr. Jacques Navy on 1/20 and scheduled for hernia repair the next day. Pt is questioning whether he should reschedule surgery. Pt state he hasn't been experiencing any SOB or CP.  Will forward to MD for recommendation

## 2020-10-06 ENCOUNTER — Other Ambulatory Visit: Payer: Self-pay | Admitting: *Deleted

## 2020-10-06 DIAGNOSIS — D122 Benign neoplasm of ascending colon: Secondary | ICD-10-CM | POA: Diagnosis not present

## 2020-10-06 DIAGNOSIS — Z86010 Personal history of colonic polyps: Secondary | ICD-10-CM | POA: Diagnosis not present

## 2020-10-06 DIAGNOSIS — K635 Polyp of colon: Secondary | ICD-10-CM | POA: Diagnosis not present

## 2020-10-06 DIAGNOSIS — D125 Benign neoplasm of sigmoid colon: Secondary | ICD-10-CM | POA: Diagnosis not present

## 2020-10-06 DIAGNOSIS — Z1211 Encounter for screening for malignant neoplasm of colon: Secondary | ICD-10-CM | POA: Diagnosis not present

## 2020-10-06 NOTE — Patient Outreach (Signed)
[facility] Care Management  10/06/2020  Hieu Herms [facility] 1951/08/16 [id number]   Second outreach for [facility] assessment and care management enrollment.  Unsuccessful, left message and requested a return call.  Will call next Wednesday for last attempt.  [name]. Myrtie Neither, MSN, [facility] Gerontological Nurse Practitioner [facility] Care Management [phone]

## 2020-10-10 ENCOUNTER — Other Ambulatory Visit: Payer: Self-pay

## 2020-10-10 ENCOUNTER — Ambulatory Visit: Payer: Medicare Other | Admitting: Cardiology

## 2020-10-10 ENCOUNTER — Encounter: Payer: Self-pay | Admitting: Cardiology

## 2020-10-10 ENCOUNTER — Other Ambulatory Visit: Payer: Self-pay | Admitting: *Deleted

## 2020-10-10 DIAGNOSIS — J439 Emphysema, unspecified: Secondary | ICD-10-CM

## 2020-10-10 DIAGNOSIS — I2510 Atherosclerotic heart disease of native coronary artery without angina pectoris: Secondary | ICD-10-CM | POA: Insufficient documentation

## 2020-10-10 DIAGNOSIS — R7303 Prediabetes: Secondary | ICD-10-CM | POA: Diagnosis not present

## 2020-10-10 DIAGNOSIS — R079 Chest pain, unspecified: Secondary | ICD-10-CM

## 2020-10-10 DIAGNOSIS — J449 Chronic obstructive pulmonary disease, unspecified: Secondary | ICD-10-CM | POA: Insufficient documentation

## 2020-10-10 DIAGNOSIS — I160 Hypertensive urgency: Secondary | ICD-10-CM

## 2020-10-10 DIAGNOSIS — E785 Hyperlipidemia, unspecified: Secondary | ICD-10-CM | POA: Insufficient documentation

## 2020-10-10 MED ORDER — IRBESARTAN 150 MG PO TABS: 150.0000 mg | ORAL_TABLET | Freq: Every day | ORAL | 3 refills | Status: DC

## 2020-10-10 MED ORDER — ROSUVASTATIN CALCIUM 40 MG PO TABS: 40.0000 mg | ORAL_TABLET | Freq: Every day | ORAL | 3 refills | Status: DC

## 2020-10-10 MED ORDER — CARVEDILOL 12.5 MG PO TABS: 12.5000 mg | ORAL_TABLET | Freq: Two times a day (BID) | ORAL | 3 refills | Status: DC

## 2020-10-10 NOTE — Assessment & Plan Note (Signed)
On Metformin-(new) Hgb A1c 6.4

## 2020-10-10 NOTE — Progress Notes (Signed)
Cardiology Office Note:    Date:  10/10/2020   ID:  Justin Waters, DOB 03/12/1951, MRN [id number]  PCP:  Ileana LaddWong, Francis P, MD  Cardiologist:  Parke PoissonGayatri A Acharya, MD  Electrophysiologist:  None   Referring MD: Ileana LaddWong, Francis P, MD   No chief complaint on file. Post hospital f/u  History of Present Illness:    Justin MuffDavid W Baines is a 70 y.o. male, former chef, who was admitted 09/17/2020 with chest pain.  He was found to be hypertensive.  His troponins were negative.  He was on no medications prior to admission.  Work-up included an echocardiogram which showed an ejection fraction of 65 to 70% with grade 1 diastolic dysfunction.  He had a coronary CT which showed moderate coronary disease with a calcium score of 2111 which was at the 95th percentile.  He underwent FFR which suggested significant distal LAD disease though it was a small caliber vessel.  Plan is for aggressive medical therapy.  He is placed on statin therapy and aspirin.  His hemoglobin A1c was also elevated and he was placed on metformin.  CT scan also showed incidental emphysema, he is a remote smoker.  Patient is in the office today for follow-up.  Since discharge she has been doing well, initially he felt fatigue but is doing better now.  He has not had chest pain.  He is tolerating his current medications.  His blood pressures controlled.  Past Medical History:  Diagnosis Date  . Arthritis   . Bipolar 1 disorder (HCC)    ?? off meds 2012  . GERD (gastroesophageal reflux disease)   . History of inguinal hernia repair   . Hx of colonic polyps   . Hypertension     Past Surgical History:  Procedure Laterality Date  . HERNIA REPAIR  2007   lap RIH TEP  . HERNIA REPAIR  08/20/2011   lap LIH  . VASECTOMY      Current Medications: Current Meds  Medication Sig  . aspirin EC 81 MG EC tablet Take 1 tablet (81 mg total) by mouth daily. Swallow whole.  . metFORMIN (GLUCOPHAGE) 500 MG tablet Take 1 tablet (500 mg total) by  mouth 2 (two) times daily with a meal.  . Multiple Vitamin (MULTIVITAMIN) capsule Take 1 capsule by mouth daily.  Marland Kitchen. omeprazole (PRILOSEC) 40 MG capsule Take 40 mg by mouth as needed.  . Probiotic Product (PROBIOTIC-10 PO) Take 1 tablet by mouth daily.  . [DISCONTINUED] carvedilol (COREG) 12.5 MG tablet Take 1 tablet (12.5 mg total) by mouth 2 (two) times daily with a meal.  . [DISCONTINUED] irbesartan (AVAPRO) 150 MG tablet Take 150 mg by mouth daily.  . [DISCONTINUED] rosuvastatin (CRESTOR) 40 MG tablet Take 1 tablet (40 mg total) by mouth daily.     Allergies:   Patient has no known allergies.   Social History   Socioeconomic History  . Marital status: Married    Spouse name: Not on file  . Number of children: Not on file  . Years of education: Not on file  . Highest education level: Not on file  Occupational History  . Not on file  Tobacco Use  . Smoking status: Former Games developermoker  . Smokeless tobacco: Former NeurosurgeonUser    Quit date: 12/10/2011  Vaping Use  . Vaping Use: Never used  Substance and Sexual Activity  . Alcohol use: Yes    Comment: 2-3 beers daily  . Drug use: No  . Sexual activity: Not on  file  Other Topics Concern  . Not on file  Social History Narrative  . Not on file   Social Determinants of Health   Financial Resource Strain: Not on file  Food Insecurity: Not on file  Transportation Needs: Not on file  Physical Activity: Not on file  Stress: Not on file  Social Connections: Not on file     Family History: The patient's family history includes Asthma in his mother; COPD in his father.  ROS:   Please see the history of present illness. He is scheduled for hernia repair Jan 21st     All other systems reviewed and are negative.  EKGs/Labs/Other Studies Reviewed:    The following studies were reviewed today: Echo 09/18/2020- IMPRESSIONS    1. Left ventricular ejection fraction, by estimation, is 65 to 70%. The  left ventricle has normal function. The  left ventricle has no regional  wall motion abnormalities. There is mild left ventricular hypertrophy.  Left ventricular diastolic parameters  are consistent with Grade I diastolic dysfunction (impaired relaxation).  2. Right ventricular systolic function is normal. The right ventricular  size is normal.  3. The mitral valve is abnormal. Mild mitral valve regurgitation.  Moderate to severe mitral annular calcification.  4. The aortic valve is tricuspid. Aortic valve regurgitation is trivial.  Mild aortic valve sclerosis is present, with no evidence of aortic valve  stenosis.  5. The inferior vena cava is normal in size with greater than 50%  respiratory variability, suggesting right atrial pressure of 3 mmHg.   Coronary CTA 09/19/2020- IMPRESSION: 1. Moderate CAD, CADRADS = 3. CT FFR will be performed and reported separately.  2. Coronary calcium score is 2111, which places the patient in the 95th percentile for age and sex matched control.  3. Normal coronary origin with right dominance.   Coronary CT [facility] 09/19/2020- 1. Left Main: FFR = 0.97  2. LAD: Proximal FFR = 0.87, Mid FFR = 0.82, Distal FFR = 0.72 3. LCX: Proximal FFR = 0.91, Distal FFR = 0.86 4. RCA: Proximal FFR = 0.97, Mid FFR =0.95, Distal FFR = 0.88  IMPRESSION: 1. CT FFR analysis showed a hemodynamically significant FFR value in the distal LAD. This is likely due to diffuse disease in the small caliber distal LAD.  RECOMMENDATIONS: Guideline-directed medical therapy and aggressive risk factor modification for secondary prevention of coronary artery disease.  EKG:  EKG is ordered today.  The ekg ordered today demonstrates NSR HR 58  Recent Labs: 09/19/2020: ALT 31; BUN 19; Creatinine, Ser 1.12; Hemoglobin 15.6; Platelets 194; Potassium 4.2; Sodium 139  Recent Lipid Panel    Component Value Date/Time   CHOL 270 (H) 09/19/2020 0307   TRIG 179 (H) 09/19/2020 0307   HDL 33 (L) 09/19/2020 0307    CHOLHDL 8.2 09/19/2020 0307   VLDL 36 09/19/2020 0307   LDLCALC 201 (H) 09/19/2020 0307    Physical Exam:    VS:  BP 118/70   Pulse (!) 58   Ht 5\' 11"  (1.803 m)   Wt 213 lb 3.2 oz (96.7 kg)   BMI 29.74 kg/m     Wt Readings from Last 3 Encounters:  10/10/20 213 lb 3.2 oz (96.7 kg)  09/20/20 213 lb 14.4 oz (97 kg)  05/11/14 194 lb 9.6 oz (88.3 kg)     GEN: Well nourished, well developed in no acute distress HEENT: Normal NECK: No JVD LYMPHATICS: No lymphadenopathy CARDIAC: RRR, no murmurs, rubs, gallops RESPIRATORY:  Clear to auscultation without  rales, wheezing or rhonchi  ABDOMEN: Soft, non-tender, non-distended MUSCULOSKELETAL:  No edema; No deformity  SKIN: Warm and dry NEUROLOGIC:  Alert and oriented x 3 PSYCHIATRIC:  Normal affect   ASSESSMENT:    Chest pain Felt to be secondary to uncontrolled HTN Dec 2021.  Hypertensive urgency Admitted 12/19-12/22/20221 B/P currently under control on his current medications  CAD (coronary artery disease) Coronary CTA done Dec 2021- moderate CAD- Ca++ score 2111, 95th percentile. FFR showed small caliber LAD with hemodynamically significant distal disease.  Plan is for medical Rx.  COPD (chronic obstructive pulmonary disease) ([place]) Emphysema noted on Ct scan Dec 2021 Remote smoker  Pre-diabetes On Metformin-(new) Hgb A1c 6.4  Dyslipidemia, goal LDL below 70 Statin added Dec 2021  PLAN:    Same Rx- he is to have hernia repair scheduled for Jan 21st.  He appears to be an acceptable candidate for this from a cardiac standpoint.  He is to see Dr Margaretann Loveless Jan 20th and will keep this appointment.    Medication Adjustments/Labs and Tests Ordered: Current medicines are reviewed at length with the patient today.  Concerns regarding medicines are outlined above.  Orders Placed This Encounter  Procedures  . EKG 12-Lead   Meds ordered this encounter  Medications  . carvedilol (COREG) 12.5 MG tablet    Sig: Take 1 tablet  (12.5 mg total) by mouth 2 (two) times daily with a meal.    Dispense:  180 tablet    Refill:  3  . irbesartan (AVAPRO) 150 MG tablet    Sig: Take 1 tablet (150 mg total) by mouth daily.    Dispense:  90 tablet    Refill:  3  . rosuvastatin (CRESTOR) 40 MG tablet    Sig: Take 1 tablet (40 mg total) by mouth daily.    Dispense:  90 tablet    Refill:  3    Patient Instructions  Medication Instructions:  Continue current medications  *If you need a refill on your cardiac medications before your next appointment, please call your pharmacy*   Lab Work: None Ordered   Testing/Procedures: None Ordered   Follow-Up: At [facility], you and your health needs are our priority.  As part of our continuing mission to provide you with exceptional heart care, we have created designated Provider Care Teams.  These Care Teams include your primary Cardiologist (physician) and Advanced Practice Providers (APPs -  Physician Assistants and Nurse Practitioners) who all work together to provide you with the care you need, when you need it.  We recommend signing up for the patient portal called "MyChart".  Sign up information is provided on this After Visit Summary.  MyChart is used to connect with patients for Virtual Visits (Telemedicine).  Patients are able to view lab/test results, encounter notes, upcoming appointments, etc.  Non-urgent messages can be sent to your provider as well.   To learn more about what you can do with MyChart, go to [email or web address].    Your next appointment:   Keep appointment on January 20th @ 10:20 am  The format for your next appointment:   In Person  Provider:   Cherlynn Kaiser, MD      Signed, Kerin Ransom, PA-C  10/10/2020 9:43 AM    [place]

## 2020-10-10 NOTE — Patient Instructions (Signed)
Medication Instructions:  Continue current medications  *If you need a refill on your cardiac medications before your next appointment, please call your pharmacy*   Lab Work: None Ordered   Testing/Procedures: None Ordered   Follow-Up: At [facility], you and your health needs are our priority.  As part of our continuing mission to provide you with exceptional heart care, we have created designated Provider Care Teams.  These Care Teams include your primary Cardiologist (physician) and Advanced Practice Providers (APPs -  Physician Assistants and Nurse Practitioners) who all work together to provide you with the care you need, when you need it.  We recommend signing up for the patient portal called "MyChart".  Sign up information is provided on this After Visit Summary.  MyChart is used to connect with patients for Virtual Visits (Telemedicine).  Patients are able to view lab/test results, encounter notes, upcoming appointments, etc.  Non-urgent messages can be sent to your provider as well.   To learn more about what you can do with MyChart, go to [email or web address].    Your next appointment:   Keep appointment on January 20th @ 10:20 am  The format for your next appointment:   In Person  Provider:   Cherlynn Kaiser, MD

## 2020-10-10 NOTE — Assessment & Plan Note (Signed)
Felt to be secondary to uncontrolled HTN Dec 2021.

## 2020-10-10 NOTE — Patient Outreach (Signed)
[facility]) Care Management  10/10/2020  [place] 1950-10-22 [id number]  Final outreach for [name] engagement. Unsuccessful, left message and requested a return call. I will close the case if I do not hear back from Justin Waters within 24 hours.  [name]. Myrtie Neither, MSN, [facility] Gerontological Nurse Practitioner [facility] Care Management [phone]

## 2020-10-10 NOTE — Assessment & Plan Note (Signed)
Statin added Dec 2021

## 2020-10-10 NOTE — Assessment & Plan Note (Signed)
Emphysema noted on Ct scan Dec 2021 Remote smoker

## 2020-10-10 NOTE — Assessment & Plan Note (Signed)
Admitted 12/19-12/22/20221 B/P currently under control on his current medications

## 2020-10-10 NOTE — Assessment & Plan Note (Signed)
Coronary CTA done Dec 2021- moderate CAD- Ca++ score 2111, 95th percentile. FFR showed small caliber LAD with hemodynamically significant distal disease.  Plan is for medical Rx.

## 2020-10-13 NOTE — Pre-Procedure Instructions (Signed)
Justin Waters [facility]  10/13/2020      [facility] [place], [address] [place] [address] Phone: [phone] Fax: [phone]    Your procedure is scheduled on Jan. 20  Report to [facility] Entrance A at 5:30 A.M.  Call this number if you have problems the morning of surgery:  [phone]   Remember:  Do not eat after midnight.   You may drink clear liquids until 4:30 AM .  Clear liquids allowed are:                    Water, Juice (non-citric and without pulp - diabetics please choose diet or no sugar options), Carbonated beverages - (diabetics please choose diet or no sugar options), Clear Tea, Black Coffee only (no creamer, milk or cream including half and half), Plain Jell-O only (diabetics please choose diet or no sugar options), Gatorade (diabetics please choose diet or no sugar options) and Plain Popsicles only                        Take these medicines the morning of surgery with A SIP OF WATER :              Carvedilol (coreg)             rosuvastatin (crestor)             Omeprazole (prilosec) if needed            7 days prior to surgery STOP taking any Aspirin (unless otherwise instructed by your surgeon), Aleve, Naproxen, Ibuprofen, Motrin, Advil, Goody's, BC's, all herbal medications, fish oil, and all vitamins.                Follow your surgeon's instructions on when to stop Aspirin.  If no instructions were given by your surgeon then you will need to call the office to get those instructions.                          How to Manage Your Diabetes Before and After Surgery  Why is it important to control my blood sugar before and after surgery? . Improving blood sugar levels before and after surgery helps healing and can limit problems. . A way of improving blood sugar control is eating a healthy diet by: o  Eating less sugar and carbohydrates o  Increasing  activity/exercise o  Talking with your doctor about reaching your blood sugar goals . High blood sugars (greater than 180 mg/dL) can raise your risk of infections and slow your recovery, so you will need to focus on controlling your diabetes during the weeks before surgery. . Make sure that the doctor who takes care of your diabetes knows about your planned surgery including the date and location.  How do I manage my blood sugar before surgery? . Check your blood sugar at least 4 times a day, starting 2 days before surgery, to make sure that the level is not too high or low. o Check your blood sugar the morning of your surgery when you wake up and every 2 hours until you get to the Short Stay unit. . If your blood sugar is less than 70 mg/dL, you will need to treat for low blood sugar: o Do not take insulin. o Treat a low blood  sugar (less than 70 mg/dL) with  cup of clear juice (cranberry or apple), 4 glucose tablets, OR glucose gel. Recheck blood sugar in 15 minutes after treatment (to make sure it is greater than 70 mg/dL). If your blood sugar is not greater than 70 mg/dL on recheck, call [phone] o  for further instructions. . Report your blood sugar to the short stay nurse when you get to Short Stay.  . If you are admitted to the hospital after surgery: o Your blood sugar will be checked by the staff and you will probably be given insulin after surgery (instead of oral diabetes medicines) to make sure you have good blood sugar levels. o The goal for blood sugar control after surgery is 80-180 mg/dL.       WHAT DO I DO ABOUT MY DIABETES MEDICATION?   Marland Kitchen Do not take oral diabetes medicines (pills) the morning of surgery.  (metformin/glucophage)      Do not wear jewelry.  Do not wear lotions, powders, or perfumes, or deodorant.  Do not shave 48 hours prior to surgery.  Men may shave face and neck.  Do not bring valuables to the hospital.  [facility] is not responsible for any  belongings or valuables.  Contacts, dentures or bridgework may not be worn into surgery.  Leave your suitcase in the car.  After surgery it may be brought to your room.  For patients admitted to the hospital, discharge time will be determined by your treatment team.  Patients discharged the day of surgery will not be allowed to drive home.    Special instructions:  [facility]- Preparing For Surgery  Before surgery, you can play an important role. Because skin is not sterile, your skin needs to be as free of germs as possible. You can reduce the number of germs on your skin by washing with CHG (chlorahexidine gluconate) Soap before surgery.  CHG is an antiseptic cleaner which kills germs and bonds with the skin to continue killing germs even after washing.    Oral Hygiene is also important to reduce your risk of infection.  Remember - BRUSH YOUR TEETH THE MORNING OF SURGERY WITH YOUR REGULAR TOOTHPASTE  Please do not use if you have an allergy to CHG or antibacterial soaps. If your skin becomes reddened/irritated stop using the CHG.  Do not shave (including legs and underarms) for at least 48 hours prior to first CHG shower. It is OK to shave your face.  Please follow these instructions carefully.   1. Shower the NIGHT BEFORE SURGERY and the MORNING OF SURGERY with CHG.   2. If you chose to wash your hair, wash your hair first as usual with your normal shampoo.  3. After you shampoo, rinse your hair and body thoroughly to remove the shampoo.  4. Use CHG as you would any other liquid soap. You can apply CHG directly to the skin and wash gently with a scrungie or a clean washcloth.   5. Apply the CHG Soap to your body ONLY FROM THE NECK DOWN.  Do not use on open wounds or open sores. Avoid contact with your eyes, ears, mouth and genitals (private parts). Wash Face and genitals (private parts)  with your normal soap.  6. Wash thoroughly, paying special attention to the area where your  surgery will be performed.  7. Thoroughly rinse your body with warm water from the neck down.  8. DO NOT shower/wash with your normal soap after using and rinsing off  the CHG Soap.  9. Pat yourself dry with a CLEAN TOWEL.  10. Wear CLEAN PAJAMAS to bed the night before surgery, wear comfortable clothes the morning of surgery  11. Place CLEAN SHEETS on your bed the night of your first shower and DO NOT SLEEP WITH PETS.    Day of Surgery:  Do not apply any deodorants/lotions.  Please wear clean clothes to the hospital/surgery center.   Remember to brush your teeth WITH YOUR REGULAR TOOTHPASTE.    Please read over the following fact sheets that you were given.

## 2020-10-16 ENCOUNTER — Encounter (HOSPITAL_COMMUNITY): Payer: Self-pay | Admitting: Physician Assistant

## 2020-10-16 ENCOUNTER — Inpatient Hospital Stay (HOSPITAL_COMMUNITY): Admission: RE | Admit: 2020-10-16 | Payer: Medicare Other | Source: Ambulatory Visit

## 2020-10-16 ENCOUNTER — Inpatient Hospital Stay (HOSPITAL_COMMUNITY)
Admission: RE | Admit: 2020-10-16 | Discharge: 2020-10-16 | Disposition: A | Discharge status: Home / Self Care | Payer: Medicare Other | Source: Ambulatory Visit

## 2020-10-16 NOTE — Progress Notes (Signed)
Patient had not arrive  for his pre-admission visit. Called pt., he stated on Friday I[place], from [place] him and stated his surgery was 10/20/20 @ [facility] and someone from there office would be getting in touch with him.  Patient is currently scheduled at [facility] for 10/19/20.

## 2020-10-17 NOTE — Patient Outreach (Signed)
[facility]) Care Management  10/17/2020  Justin Waters [facility] November 17, 1950 [id number]  Last telephone outreach attempted for care management services, Unsuccessful, left message that this would be last call.  [name]. Myrtie Neither, MSN, [facility] Gerontological Nurse Practitioner [facility] Care Management [phone]

## 2020-10-18 ENCOUNTER — Ambulatory Visit: Payer: Self-pay | Admitting: General Surgery

## 2020-10-18 NOTE — Progress Notes (Signed)
Per Rosendo Gros at Dr. Rosendo Gros, surgery has been moved to the [facility].  OR desk aware.

## 2020-10-18 NOTE — H&P (Signed)
History of Present Illness Ralene Ok MD; 08/08/2020 3:34 PM) The patient is a 70 year old male who presents with an inguinal hernia. Chief Complaint: Left inguinal hernia patient is a 70 year old male, comes in secondary to left inguinal pain. Patient states that he has had a previous laparoscopic hernia repair with mesh about 10 years ago. [place] was by Dr. gross. Patient states he has some discomfort and burning with straining. He notices maybe a small bulge to the left inguinal area.  He's had no signs or symptoms of incarceration or granulation.     Past Surgical History [facility] Teressa Senter, [place]; 08/08/2020 3:12 PM) Colon Polyp Removal - Colonoscopy  Laparoscopic Inguinal Hernia Surgery  Bilateral.  Diagnostic Studies History (Chanel Teressa Senter, CMA; 08/08/2020 3:12 PM) Colonoscopy  1-5 years ago  Allergies Emmaline Kluver Teressa Senter, CMA; 08/08/2020 3:12 PM) No Known Drug Allergies  [08/08/2020]: Allergies Reconciled   Medication History (Chanel Teressa Senter, [place]; 08/08/2020 3:12 PM) No Current Medications Medications Reconciled  Social History (Chanel Teressa Senter, CMA; 08/08/2020 3:12 PM) Alcohol use  Occasional alcohol use. Caffeine use  Coffee. Tobacco use  Former smoker.  Family History ([place], [place]; 08/08/2020 3:12 PM) Family history unknown  First Degree Relatives  Heart Disease  Mother.  Other Problems Antonietta Jewel, CMA; 08/08/2020 3:12 PM) Gastroesophageal Reflux Disease  Hemorrhoids  Inguinal Hernia     Review of Systems Ralene Ok MD; 08/08/2020 3:33 PM) General Not Present- Appetite Loss, Chills, Fatigue, Fever, Night Sweats, Weight Gain and Weight Loss. Skin Not Present- Change in Wart/Mole, Dryness, Hives, Jaundice, New Lesions, Non-Healing Wounds, Rash and Ulcer. HEENT Present- Sinus Pain and Wears glasses/contact lenses. Not Present- Earache, Hearing Loss, Hoarseness, Nose Bleed, Oral Ulcers, Ringing in the Ears, Seasonal Allergies, Sore Throat, Visual  Disturbances and Yellow Eyes. Respiratory Not Present- Bloody sputum, Chronic Cough, Difficulty Breathing, Snoring and Wheezing. Breast Not Present- Breast Mass, Breast Pain, Nipple Discharge and Skin Changes. Cardiovascular Present- Shortness of Breath. Not Present- Chest Pain, Difficulty Breathing Lying Down, Leg Cramps, Palpitations, Rapid Heart Rate and Swelling of Extremities. Gastrointestinal Present- Hemorrhoids. Not Present- Abdominal Pain, Bloating, Bloody Stool, Change in Bowel Habits, Chronic diarrhea, Constipation, Difficulty Swallowing, Excessive gas, Gets full quickly at meals, Indigestion, Nausea, Rectal Pain and Vomiting. Male Genitourinary Present- Frequency. Not Present- Blood in Urine, Change in Urinary Stream, Impotence, Nocturia, Painful Urination, Urgency and Urine Leakage. Musculoskeletal Present- Back Pain and Joint Stiffness. Not Present- Joint Pain, Muscle Pain, Muscle Weakness and Swelling of Extremities. Neurological Not Present- Decreased Memory, Fainting, Headaches, Numbness, Seizures, Tingling, Tremor, Trouble walking and Weakness. Psychiatric Not Present- Anxiety, Bipolar, Change in Sleep Pattern, Depression, Fearful and Frequent crying. Endocrine Not Present- Cold Intolerance, Excessive Hunger, Hair Changes, Heat Intolerance, Hot flashes and New Diabetes. Hematology Not Present- Blood Thinners, Easy Bruising, Excessive bleeding, Gland problems, HIV and Persistent Infections. All other systems negative  Vitals (Chanel Nolan CMA; 08/08/2020 3:12 PM) 08/08/2020 3:12 PM Weight: 216.25 lb Height: 71in Body Surface Area: 2.18 m Body Mass Index: 30.16 kg/m  Temp.: 97.66F  Pulse: 93 (Regular)  BP: 132/76(Sitting, Left Arm, Standard)       Physical Exam Ralene Ok MD; 08/08/2020 3:34 PM) The physical exam findings are as follows: Note: Constitutional: No acute distress, conversant, appears stated age  Eyes: Anicteric sclerae, moist conjunctiva,  no lid lag  Neck: No thyromegaly, trachea midline, no cervical lymphadenopathy  Lungs: Clear to auscultation biilaterally, normal respiratory effot  Cardiovascular: regular rate & rhythm, no murmurs, no peripheal edema, pedal pulses 2+  GI:  Soft, no masses or hepatosplenomegaly, non-tender to palpation  MSK: Normal gait, no clubbing cyanosis, edema  Skin: No rashes, palpation reveals normal skin turgor  Psychiatric: Appropriate judgment and insight, oriented to person, place, and time  Abdomen Inspection Hernias - Left - Inguinal hernia - Reducible - Left.    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 1. The patient will like to proceed to the operating room for open left inguinal hernia repair with mesh.  2. I discussed with the patient the signs and symptoms of incarceration and strangulation and the need to proceed to the ER should they occur.  3. I discussed with the patient the risks and benefits of the procedure to include but not limited to: Infection, bleeding, damage to surrounding structures, possible need for further surgery, possible nerve pain, and possible recurrence. The patient was understanding and wishes to proceed.

## 2020-10-19 ENCOUNTER — Ambulatory Visit (HOSPITAL_COMMUNITY): Admission: RE | Admit: 2020-10-19 | Payer: Medicare Other | Source: Home / Self Care | Admitting: General Surgery

## 2020-10-19 ENCOUNTER — Ambulatory Visit: Payer: Medicare Other | Admitting: Internal Medicine

## 2020-10-19 ENCOUNTER — Other Ambulatory Visit: Payer: Self-pay

## 2020-10-19 ENCOUNTER — Encounter (HOSPITAL_COMMUNITY): Admission: RE | Payer: Self-pay | Source: Home / Self Care

## 2020-10-19 ENCOUNTER — Encounter: Payer: Self-pay | Admitting: Internal Medicine

## 2020-10-19 VITALS — BP 136/74 | HR 56 | Ht 72.0 in | Wt 211.0 lb

## 2020-10-19 DIAGNOSIS — R7303 Prediabetes: Secondary | ICD-10-CM

## 2020-10-19 DIAGNOSIS — I160 Hypertensive urgency: Secondary | ICD-10-CM

## 2020-10-19 DIAGNOSIS — R079 Chest pain, unspecified: Secondary | ICD-10-CM | POA: Diagnosis not present

## 2020-10-19 DIAGNOSIS — E785 Hyperlipidemia, unspecified: Secondary | ICD-10-CM

## 2020-10-19 DIAGNOSIS — I2510 Atherosclerotic heart disease of native coronary artery without angina pectoris: Secondary | ICD-10-CM | POA: Diagnosis not present

## 2020-10-19 DIAGNOSIS — Z01810 Encounter for preprocedural cardiovascular examination: Secondary | ICD-10-CM

## 2020-10-19 DIAGNOSIS — J439 Emphysema, unspecified: Secondary | ICD-10-CM

## 2020-10-19 SURGERY — REPAIR, HERNIA, INGUINAL, ADULT
Anesthesia: General | Laterality: Left

## 2020-10-19 NOTE — Patient Instructions (Signed)
Medication Instructions:  No Changes In Medications at this time.  *If you need a refill on your cardiac medications before your next appointment, please call your pharmacy*  Lab Work: LIPIDS AND CMP RIGHT BEFORE NEXT OFFICE VISIT  If you have labs (blood work) drawn today and your tests are completely normal, you will receive your results only by: Marland Kitchen MyChart Message (if you have MyChart) OR . A paper copy in the mail If you have any lab test that is abnormal or we need to change your treatment, we will call you to review the results.  Follow-Up: At [facility], you and your health needs are our priority.  As part of our continuing mission to provide you with exceptional heart care, we have created designated Provider Care Teams.  These Care Teams include your primary Cardiologist (physician) and Advanced Practice Providers (APPs -  Physician Assistants and Nurse Practitioners) who all work together to provide you with the care you need, when you need it.  We recommend signing up for the patient portal called "MyChart".  Sign up information is provided on this After Visit Summary.  MyChart is used to connect with patients for Virtual Visits (Telemedicine).  Patients are able to view lab/test results, encounter notes, upcoming appointments, etc.  Non-urgent messages can be sent to your provider as well.   To learn more about what you can do with MyChart, go to [email or web address].    Your next appointment:   3 month(s)  The format for your next appointment:   In Person  Provider:   Cherlynn Kaiser, MD

## 2020-10-19 NOTE — Progress Notes (Signed)
Cardiology Office Note:    Date:  10/19/2020   ID:  Justin Waters, DOB [birth year], MRN [id number]  PCP:  Vernie Shanks, MD  Cardiologist:  Elouise Munroe, MD  Electrophysiologist:  None   Referring MD: Lawerance Cruel, MD   Chief Complaint/Reason for Referral: Moderate coronary artery disease, hypertension  History of Present Illness:    Justin Waters is a 70 y.o. male with a history of chest pain with recent hospital admission found to have moderate coronary artery disease with a calcium score of 2111 with plan for secondary prevention medical management of CAD.  He also has significant hypertension presenting with hypertensive urgency to the hospital, hyperlipidemia with an LDL of 201, and prediabetes with an A1c of 6.4 now on Metformin.  He has done well since his hospital dismissal and has recently seen my colleague Justin Ransom, PA-C.  He is anticipating hernia surgery on February 21.  He had hoped to get this done sooner but delays were in place due to COVID-19 restrictions.  He did have an episode of hypotension on December 31.  He felt poorly and felt washed out.  He volume resuscitated with coffee and fluid and he felt better.  He has done well on his medication regimen for hypertension.  We discussed management of risk factors in detail today to prevent progression of coronary artery disease.  He has a remote smoking history and incidentally noted emphysema on recent CT.  Past Medical History:  Diagnosis Date  . Arthritis   . Bipolar 1 disorder ([place])    ?? off meds 2012  . GERD (gastroesophageal reflux disease)   . History of inguinal hernia repair   . Hx of colonic polyps   . Hypertension     Past Surgical History:  Procedure Laterality Date  . HERNIA REPAIR  2007   lap [facility] TEP  . HERNIA REPAIR  08/20/2011   lap [facility]  . VASECTOMY      Current Medications: Current Meds  Medication Sig  . aspirin EC 81 MG EC tablet Take 1 tablet (81 mg total) by mouth  daily. Swallow whole.  . carvedilol (COREG) 12.5 MG tablet Take 1 tablet (12.5 mg total) by mouth 2 (two) times daily with a meal.  . irbesartan (AVAPRO) 150 MG tablet Take 1 tablet (150 mg total) by mouth daily.  . metFORMIN (GLUCOPHAGE) 500 MG tablet Take 1 tablet (500 mg total) by mouth 2 (two) times daily with a meal.  . Multiple Vitamin (MULTIVITAMIN) capsule Take 1 capsule by mouth daily.  Marland Kitchen omeprazole (PRILOSEC) 40 MG capsule Take 40 mg by mouth as needed.  . Probiotic Product (PROBIOTIC-10 PO) Take 1 tablet by mouth daily.  . rosuvastatin (CRESTOR) 40 MG tablet Take 1 tablet (40 mg total) by mouth daily.     Allergies:   Patient has no known allergies.   Social History   Tobacco Use  . Smoking status: Former Research scientist (life sciences)  . Smokeless tobacco: Former Systems developer    Quit date: 12/10/2011  Vaping Use  . Vaping Use: Never used  Substance Use Topics  . Alcohol use: Yes    Comment: 2-3 beers daily  . Drug use: No     Family History: The patient's family history includes Asthma in his mother; COPD in his father.  ROS:   Please see the history of present illness.    All other systems reviewed and are negative.  EKGs/Labs/Other Studies Reviewed:    The following studies  were reviewed today:  EKG:  Sinus bradycardia, rate 56   Recent Labs: 09/19/2020: ALT 31; BUN 19; Creatinine, Ser 1.12; Hemoglobin 15.6; Platelets 194; Potassium 4.2; Sodium 139  Recent Lipid Panel    Component Value Date/Time   CHOL 270 (H) 09/19/2020 0307   TRIG 179 (H) 09/19/2020 0307   HDL 33 (L) 09/19/2020 0307   CHOLHDL 8.2 09/19/2020 0307   VLDL 36 09/19/2020 0307   LDLCALC 201 (H) 09/19/2020 0307    Physical Exam:    VS:  BP 136/74   Pulse (!) 56   Ht 6' (1.829 m)   Wt 211 lb (95.7 kg)   SpO2 96%   BMI 28.62 kg/m     Wt Readings from Last 5 Encounters:  10/19/20 211 lb (95.7 kg)  10/10/20 213 lb 3.2 oz (96.7 kg)  09/20/20 213 lb 14.4 oz (97 kg)  05/11/14 194 lb 9.6 oz (88.3 kg)  04/19/14  185 lb (83.9 kg)    Constitutional: No acute distress Eyes: sclera non-icteric, normal conjunctiva and lids ENMT: normal dentition, moist mucous membranes Cardiovascular: regular rhythm, normal rate, no murmurs. S1 and S2 normal. Radial pulses normal bilaterally. No jugular venous distention.  Respiratory: clear to auscultation bilaterally GI : normal bowel sounds, soft and nontender. No distention.   MSK: extremities warm, well perfused. No edema.  NEURO: grossly nonfocal exam, moves all extremities. PSYCH: alert and oriented x 3, normal mood and affect.   ASSESSMENT:    1. Coronary artery disease involving native coronary artery of native heart without angina pectoris   2. Dyslipidemia, goal LDL below 70   3. Hypertensive urgency   4. Chest pain, unspecified type   5. Pulmonary emphysema, unspecified emphysema type ([place])   6. Pre-diabetes   7. Preoperative cardiovascular examination    PLAN:    Preoperative cardiovascular exam-patient incidentally tells me that he is anticipating hernia surgery which I was not aware of prior to this appointment.  We reviewed cardiovascular risk and optimization at this visit. The patient is intermediate risk for intermediate risk procedure.  Risk is felt to be nonmodifiable at this time.  Risk factors include recent hypertensive urgency, moderate coronary artery disease with chest pain, COPD noted on CT scan, prediabetes.  No further cardiovascular testing is required prior to the procedure.  If this level of risk is acceptable to the patient and surgical team, the patient should be considered optimized from a cardiovascular standpoint.  The patient understands this risk ratification and is willing to proceed.  Coronary artery disease-we discussed in detail indications for aspirin 81 mg daily which I would recommend continuing.  He is also on beta-blocker with carvedilol, and is tolerating Crestor 40 mg daily well.  Dyslipidemia-we will need to recheck  his cholesterol 3 months from the start of his Crestor and we will arrange this.  Continue Crestor.  Hypertension-recent hypertensive urgency.  Blood pressure with reasonably good control today.  Continue carvedilol, irbesartan.  No clear indication for additional therapy at this time but will consider if needed.  Total time of encounter: 30 minutes total time of encounter, including 25 minutes spent in face-to-face patient care on the date of this encounter. This time includes coordination of care and counseling regarding above mentioned problem list. Remainder of non-face-to-face time involved reviewing chart documents/testing relevant to the patient encounter and documentation in the medical record. I have independently reviewed documentation from referring provider.   Cherlynn Kaiser, MD [place]  Medication Adjustments/Labs and Tests Ordered: Current medicines are reviewed at length with the patient today.  Concerns regarding medicines are outlined above.   Orders Placed This Encounter  Procedures  . Lipid panel  . Comprehensive metabolic panel  . EKG 12-Lead    No orders of the defined types were placed in this encounter.   Patient Instructions  Medication Instructions:  No Changes In Medications at this time.  *If you need a refill on your cardiac medications before your next appointment, please call your pharmacy*  Lab Work: LIPIDS AND CMP RIGHT BEFORE NEXT OFFICE VISIT  If you have labs (blood work) drawn today and your tests are completely normal, you will receive your results only by: Marland Kitchen MyChart Message (if you have MyChart) OR . A paper copy in the mail If you have any lab test that is abnormal or we need to change your treatment, we will call you to review the results.  Follow-Up: At [facility], you and your health needs are our priority.  As part of our continuing mission to provide you with exceptional heart care, we have created designated  Provider Care Teams.  These Care Teams include your primary Cardiologist (physician) and Advanced Practice Providers (APPs -  Physician Assistants and Nurse Practitioners) who all work together to provide you with the care you need, when you need it.  We recommend signing up for the patient portal called "MyChart".  Sign up information is provided on this After Visit Summary.  MyChart is used to connect with patients for Virtual Visits (Telemedicine).  Patients are able to view lab/test results, encounter notes, upcoming appointments, etc.  Non-urgent messages can be sent to your provider as well.   To learn more about what you can do with MyChart, go to [email or web address].    Your next appointment:   3 month(s)  The format for your next appointment:   In Person  Provider:   Cherlynn Kaiser, MD

## 2020-11-16 ENCOUNTER — Telehealth: Payer: Self-pay | Admitting: Internal Medicine

## 2020-11-16 DIAGNOSIS — Z01818 Encounter for other preprocedural examination: Secondary | ICD-10-CM | POA: Diagnosis not present

## 2020-11-16 NOTE — Telephone Encounter (Signed)
Spoke with Sunday Spillers from surgical center. She says that the anesthesiologist does not need a surgical clearance only to be able to review note from Dr. Margaretann Loveless on 10/19/20. Dr. Margaretann Loveless is aware of needing to sign this note and states that she will get to it when she can today.

## 2020-11-16 NOTE — Telephone Encounter (Signed)
Sunday Spillers is calling requesting Dr. Margaretann Loveless close this patient's 10/19/20 OV notes so they are able to use it for surgical clearance. Please advise.

## 2020-11-17 ENCOUNTER — Telehealth: Payer: Self-pay

## 2020-11-17 NOTE — Telephone Encounter (Signed)
Spoke with Sunday Spillers from the surgical center called this morning about getting office note from 10/19/20 closed so they can have access to that note for pt's hernia surgery on Monday. Will send a note to Dr. Margaretann Loveless about getting note completed.

## 2020-11-20 DIAGNOSIS — K4091 Unilateral inguinal hernia, without obstruction or gangrene, recurrent: Secondary | ICD-10-CM | POA: Diagnosis not present

## 2020-11-20 DIAGNOSIS — K4090 Unilateral inguinal hernia, without obstruction or gangrene, not specified as recurrent: Secondary | ICD-10-CM | POA: Diagnosis not present

## 2020-11-20 DIAGNOSIS — G8918 Other acute postprocedural pain: Secondary | ICD-10-CM | POA: Diagnosis not present

## 2020-12-04 DIAGNOSIS — K2100 Gastro-esophageal reflux disease with esophagitis, without bleeding: Secondary | ICD-10-CM | POA: Diagnosis not present

## 2020-12-04 DIAGNOSIS — I2510 Atherosclerotic heart disease of native coronary artery without angina pectoris: Secondary | ICD-10-CM | POA: Diagnosis not present

## 2020-12-04 DIAGNOSIS — I10 Essential (primary) hypertension: Secondary | ICD-10-CM | POA: Diagnosis not present

## 2020-12-04 DIAGNOSIS — E7800 Pure hypercholesterolemia, unspecified: Secondary | ICD-10-CM | POA: Diagnosis not present

## 2021-01-03 DIAGNOSIS — Z1389 Encounter for screening for other disorder: Secondary | ICD-10-CM | POA: Diagnosis not present

## 2021-01-03 DIAGNOSIS — Z0000 Encounter for general adult medical examination without abnormal findings: Secondary | ICD-10-CM | POA: Diagnosis not present

## 2021-01-22 ENCOUNTER — Ambulatory Visit: Payer: Medicare Other | Admitting: Internal Medicine

## 2021-04-06 DIAGNOSIS — K2100 Gastro-esophageal reflux disease with esophagitis, without bleeding: Secondary | ICD-10-CM | POA: Diagnosis not present

## 2021-04-06 DIAGNOSIS — R350 Frequency of micturition: Secondary | ICD-10-CM | POA: Diagnosis not present

## 2021-04-06 DIAGNOSIS — E1169 Type 2 diabetes mellitus with other specified complication: Secondary | ICD-10-CM | POA: Diagnosis not present

## 2021-04-06 DIAGNOSIS — J439 Emphysema, unspecified: Secondary | ICD-10-CM | POA: Diagnosis not present

## 2021-04-06 DIAGNOSIS — E7800 Pure hypercholesterolemia, unspecified: Secondary | ICD-10-CM | POA: Diagnosis not present

## 2021-04-06 DIAGNOSIS — I10 Essential (primary) hypertension: Secondary | ICD-10-CM | POA: Diagnosis not present

## 2021-04-06 DIAGNOSIS — I2510 Atherosclerotic heart disease of native coronary artery without angina pectoris: Secondary | ICD-10-CM | POA: Diagnosis not present

## 2021-04-30 ENCOUNTER — Other Ambulatory Visit: Payer: Self-pay | Admitting: *Deleted

## 2021-04-30 DIAGNOSIS — Z87891 Personal history of nicotine dependence: Secondary | ICD-10-CM

## 2021-06-05 DIAGNOSIS — I2510 Atherosclerotic heart disease of native coronary artery without angina pectoris: Secondary | ICD-10-CM | POA: Diagnosis not present

## 2021-06-05 DIAGNOSIS — K2100 Gastro-esophageal reflux disease with esophagitis, without bleeding: Secondary | ICD-10-CM | POA: Diagnosis not present

## 2021-06-05 DIAGNOSIS — I10 Essential (primary) hypertension: Secondary | ICD-10-CM | POA: Diagnosis not present

## 2021-06-05 DIAGNOSIS — E7800 Pure hypercholesterolemia, unspecified: Secondary | ICD-10-CM | POA: Diagnosis not present

## 2021-06-07 ENCOUNTER — Ambulatory Visit
Admission: RE | Admit: 2021-06-07 | Discharge: 2021-06-07 | Disposition: A | Discharge status: Home / Self Care | Payer: Medicare Other | Source: Ambulatory Visit | Attending: Acute Care | Admitting: Acute Care

## 2021-06-07 ENCOUNTER — Telehealth (INDEPENDENT_AMBULATORY_CARE_PROVIDER_SITE_OTHER): Payer: Medicare Other | Admitting: Primary Care

## 2021-06-07 ENCOUNTER — Encounter: Payer: Self-pay | Admitting: Primary Care

## 2021-06-07 DIAGNOSIS — Z87891 Personal history of nicotine dependence: Secondary | ICD-10-CM | POA: Diagnosis not present

## 2021-06-07 DIAGNOSIS — J439 Emphysema, unspecified: Secondary | ICD-10-CM

## 2021-06-07 DIAGNOSIS — I700 Atherosclerosis of aorta: Secondary | ICD-10-CM

## 2021-06-07 NOTE — Patient Instructions (Signed)
Thank you for participating in the [facility]. It was our pleasure to meet you today. We will call you with the results of your scan within the next few days. Your scan will be assigned a Lung RADS category score by the physicians reading the scans.  This Lung RADS score determines follow up scanning.  See below for description of categories, and follow up screening recommendations. We will be in touch to schedule your follow up screening annually or based on recommendations of our providers. We will fax a copy of your scan results to your Primary Care Physician, or the physician who referred you to the program, to ensure they have the results. Please call the office if you have any questions or concerns regarding your scanning experience or results.  Our office number is [phone]. Please speak with Denise Phelps, RN. She is our Lung Cancer Screening RN. If she is unavailable when you call, please have the office staff send her a message. She will return your call at her earliest convenience. Remember, if your scan is normal, we will scan you annually as long as you continue to meet the criteria for the program. (Age 55-77, Current smoker or smoker who has quit within the last 15 years). If you are a smoker, remember, quitting is the single most powerful action that you can take to decrease your risk of lung cancer and other pulmonary, breathing related problems. We know quitting is hard, and we are here to help.  Please let us know if there is anything we can do to help you meet your goal of quitting. If you are a former smoker, congratulations. We are proud of you! Remain smoke free! Remember you can refer friends or family members through the number above.  We will screen them to make sure they meet criteria for the program. Thank you for helping us take better care of you by participating in Lung Screening.  Lung RADS Categories:  Lung RADS 1: no nodules  or definitely non-concerning nodules.  Recommendation is for a repeat annual scan in 12 months.  Lung RADS 2:  nodules that are non-concerning in appearance and behavior with a very low likelihood of becoming an active cancer. Recommendation is for a repeat annual scan in 12 months.  Lung RADS 3: nodules that are probably non-concerning , includes nodules with a low likelihood of becoming an active cancer.  Recommendation is for a 6-month repeat screening scan. Often noted after an upper respiratory illness. We will be in touch to make sure you have no questions, and to schedule your 6-month scan.  Lung RADS 4 A: nodules with concerning findings, recommendation is most often for a follow up scan in 3 months or additional testing based on our provider's assessment of the scan. We will be in touch to make sure you have no questions and to schedule the recommended 3 month follow up scan.  Lung RADS 4 B:  indicates findings that are concerning. We will be in touch with you to schedule additional diagnostic testing based on our provider's  assessment of the scan.   

## 2021-06-07 NOTE — Progress Notes (Signed)
Shared Decision Making Visit Lung Cancer Screening Program [phone])   Eligibility: Age 70 y.o. Pack Years Smoking History Calculation 40 (# packs/per year x # years smoked) Recent History of coughing up blood  no Unexplained weight loss? no ( >Than 15 pounds within the last 6 months ) Prior History Lung / other cancer no (Diagnosis within the last 5 years already requiring surveillance chest CT Scans). Smoking Status Former Smoker Former Smokers: Years since quit: 7 years   Quit Date: 2015  Visit Components: Discussion included one or more decision making aids. yes Discussion included risk/benefits of screening. yes Discussion included potential follow up diagnostic testing for abnormal scans. yes Discussion included meaning and risk of over diagnosis. yes Discussion included meaning and risk of False Positives. yes Discussion included meaning of total radiation exposure. yes  Counseling Included: Importance of adherence to annual lung cancer LDCT screening. yes Impact of comorbidities on ability to participate in the program. yes Ability and willingness to under diagnostic treatment. yes  Smoking Cessation Counseling: Current Smokers:  Discussed importance of smoking cessation. yes Information about tobacco cessation classes and interventions provided to patient. yes Patient provided with "ticket" for LDCT Scan. NA, virtual visit  Symptomatic Patient. no  Counseling(Intermediate counseling: > three minutes) [id number] Diagnosis Code: Tobacco Use Z72.0 Asymptomatic Patient yes  Counseling (Intermediate counseling: > three minutes counseling) [id number] Former Smokers:  Discussed the importance of maintaining cigarette abstinence. yes Diagnosis Code: Personal History of Nicotine Dependence. [id number] Information about tobacco cessation classes and interventions provided to patient. Yes Patient provided with "ticket" for LDCT Scan. NA, virtual visit Written Order for Lung Cancer  Screening with LDCT placed in Epic. Yes (CT Chest Lung Cancer Screening Low Dose W/O CM) [id number] Z12.2-Screening of respiratory organs Z87.891-Personal history of nicotine dependence  I have spent 25 minutes of face to face time with Mr. Romack discussing the risks and benefits of lung cancer screening. We viewed a power point together that explained in detail the above noted topics. We paused at intervals to allow for questions to be asked and answered to ensure understanding.We discussed that the single most powerful action that he can take to decrease his risk of developing lung cancer is to quit smoking. We discussed whether or not he is ready to commit to setting a quit date. We discussed options for tools to aid in quitting smoking including nicotine replacement therapy, non-nicotine medications, support groups, Quit Smart classes, and behavior modification. We discussed that often times setting smaller, more achievable goals, such as eliminating 1 cigarette a day for a week and then 2 cigarettes a day for a week can be helpful in slowly decreasing the number of cigarettes smoked. This allows for a sense of accomplishment as well as providing a clinical benefit. I gave him the " Be Stronger Than Your Excuses" card with contact information for community resources, classes, free nicotine replacement therapy, and access to mobile apps, text messaging, and on-line smoking cessation help. I have also given him my card and contact information in the event he needs to contact me. We discussed the time and location of the scan, and that either Doroteo Glassman RN or I will call with the results within 24-48 hours of receiving them. I have offered him  a copy of the power point we viewed  as a resource in the event they need reinforcement of the concepts we discussed today in the office. The patient verbalized understanding of all of  the above and had no  further questions upon leaving the office. They have my  contact information in the event they have any further questions.  I spent 3 minutes counseling on smoking cessation and the health risks of continued tobacco abuse.  I explained to the patient that there has been a high incidence of coronary artery disease noted on these exams. I explained that this is a non-gated exam therefore degree or severity cannot be determined. This patient is on statin therapy. I have asked the patient to follow-up with their PCP regarding any incidental finding of coronary artery disease and management with diet or medication as their PCP  feels is clinically indicated. The patient verbalized understanding of the above and had no further questions upon completion of the visit.   Patient reports he quit smoking all together in 2015. He is taking cholesterol medication. No other medical concerns.    Martyn Ehrich, NP

## 2021-07-04 ENCOUNTER — Other Ambulatory Visit: Payer: Self-pay | Admitting: *Deleted

## 2021-07-04 DIAGNOSIS — Z87891 Personal history of nicotine dependence: Secondary | ICD-10-CM

## 2021-07-04 NOTE — Progress Notes (Signed)
Lung RADS 2: nodules that are benign in appearance and behavior with a very low likelihood of becoming a clinically active cancer due to size or lack of growth. Recommendation per radiology is for a repeat LDCT in 12 months.  Justin Waters, please order annual screening can and fax results to PCP.   Specifically, there is aortic atherosclerosis, in addition to left main and 3 vessel coronary artery disease. Please note that although the presence of coronary artery calcium documents the presence of coronary artery disease, the severity of this disease and any potential stenosis cannot be assessed on this non-gated CT examination. Assessment for potential risk factor modification, dietary therapy or pharmacologic therapy may be warranted, if clinically indicated.  There are severe calcifications of the aortic valve and mitral annulus. Echocardiographic correlation for evaluation of potential valvular dysfunction may be warranted if clinically indicated   Significant CAD, including L main disease Needs referral to Cardiology and possibly an echo. Have him follow up with his PCP. Thanks so much

## 2021-08-30 DIAGNOSIS — M654 Radial styloid tenosynovitis [de Quervain]: Secondary | ICD-10-CM | POA: Diagnosis not present

## 2021-09-17 DIAGNOSIS — M654 Radial styloid tenosynovitis [de Quervain]: Secondary | ICD-10-CM | POA: Diagnosis not present

## 2021-09-20 DIAGNOSIS — M25532 Pain in left wrist: Secondary | ICD-10-CM | POA: Diagnosis not present

## 2021-09-20 DIAGNOSIS — M654 Radial styloid tenosynovitis [de Quervain]: Secondary | ICD-10-CM | POA: Diagnosis not present

## 2021-10-12 ENCOUNTER — Ambulatory Visit: Payer: Medicare Other | Admitting: Nurse Practitioner

## 2021-10-12 ENCOUNTER — Other Ambulatory Visit: Payer: Self-pay

## 2021-10-12 ENCOUNTER — Encounter: Payer: Self-pay | Admitting: Adult Health

## 2021-10-12 ENCOUNTER — Telehealth: Payer: Self-pay | Admitting: Primary Care

## 2021-10-12 VITALS — BP 122/60 | HR 65 | Ht 72.0 in | Wt 200.0 lb

## 2021-10-12 DIAGNOSIS — Z79899 Other long term (current) drug therapy: Secondary | ICD-10-CM

## 2021-10-12 DIAGNOSIS — J439 Emphysema, unspecified: Secondary | ICD-10-CM

## 2021-10-12 DIAGNOSIS — I5189 Other ill-defined heart diseases: Secondary | ICD-10-CM

## 2021-10-12 DIAGNOSIS — I358 Other nonrheumatic aortic valve disorders: Secondary | ICD-10-CM | POA: Diagnosis not present

## 2021-10-12 DIAGNOSIS — I951 Orthostatic hypotension: Secondary | ICD-10-CM

## 2021-10-12 DIAGNOSIS — R252 Cramp and spasm: Secondary | ICD-10-CM

## 2021-10-12 DIAGNOSIS — I700 Atherosclerosis of aorta: Secondary | ICD-10-CM | POA: Diagnosis not present

## 2021-10-12 DIAGNOSIS — Z87891 Personal history of nicotine dependence: Secondary | ICD-10-CM

## 2021-10-12 DIAGNOSIS — E785 Hyperlipidemia, unspecified: Secondary | ICD-10-CM

## 2021-10-12 DIAGNOSIS — I2510 Atherosclerotic heart disease of native coronary artery without angina pectoris: Secondary | ICD-10-CM

## 2021-10-12 DIAGNOSIS — I340 Nonrheumatic mitral (valve) insufficiency: Secondary | ICD-10-CM

## 2021-10-12 DIAGNOSIS — R079 Chest pain, unspecified: Secondary | ICD-10-CM | POA: Diagnosis not present

## 2021-10-12 DIAGNOSIS — I10 Essential (primary) hypertension: Secondary | ICD-10-CM

## 2021-10-12 DIAGNOSIS — R7303 Prediabetes: Secondary | ICD-10-CM

## 2021-10-12 MED ORDER — CARVEDILOL 6.25 MG PO TABS: 6.2500 mg | ORAL_TABLET | Freq: Two times a day (BID) | ORAL | 3 refills | Status: DC

## 2021-10-12 NOTE — Telephone Encounter (Signed)
Called pt and there was no answer- LMTCB   His results are in Epic and so is his cardiologist to he should already be able to view the results

## 2021-10-12 NOTE — Progress Notes (Signed)
Office Visit    Patient Name: Justin Waters [facility] Date of Encounter: 10/12/2021  Primary Care Provider:  Vernie Shanks, MD Primary Cardiologist:  Elouise Munroe, MD  Chief Complaint    71 year old male with a history of moderate CAD (per coronary CTA 08/2020, calcium score 2111, managed medically), hypertension with hypertensive urgency, hyperlipidemia, prediabetes, COPD, tobacco use and GERD who presents for follow-up related to CAD and hypertension.  Past Medical History    Past Medical History:  Diagnosis Date   Arthritis    Bipolar 1 disorder ([place])    ?? off meds 2012   GERD (gastroesophageal reflux disease)    History of inguinal hernia repair    Hx of colonic polyps    Hypertension    Past Surgical History:  Procedure Laterality Date   HERNIA REPAIR  2007   lap RIH TEP   HERNIA REPAIR  08/20/2011   lap [facility]   VASECTOMY      Allergies  No Known Allergies  History of Present Illness    71 year old male with the above past medical history including moderate CAD (per coronary CTA 08/2020, calcium score 2111, managed medically), hypertension with hypertensive urgency, hyperlipidemia, prediabetes, COPD, tobacco use, and GERD.  He was found to have moderate CAD on coronary CTA during hospitalization in the setting of chest pain, hypertensive urgency in December 2021. managed medically.  Coronary CTA with FFR revealed a calcium score of 2111, 95th percentile for age and sex, as well as plaque in the LM, LAD, LCx, and RCA, though FFR hemodynamically significant only in the distal LAD, likely due to diffuse disease in the small caliber distal LAD. Echocardiogram at the time showed an EF of 65 to 70%, no R WMA, mild LVH, G1 DD, mild-moderate MR, moderate to severe mitral annular calcification, mild aortic valve sclerosis with no evidence of stenosis. He was last seen in the office on October 27, 2020 for preoperative clearance in anticipation of hernia repair surgery and was  doing well from a cardiac standpoint. He was advised to follow-up with cardiology following a routine CT chest for lung cancer screening that showed aortic atherosclerosis, left main and three-vessel CAD, as well as severe calcifications of the aortic valve and mitral annulus.  He presents today for follow-up. Since his last visit he has been stable overall from a cardiac standpoint.  He does state that he was advised to follow-up with cardiology following his CT of the chest for lung cancer screening.  He reports occasional lightheadedness with position changes. He has had low blood pressure readings occasionally in the setting of lightheadedness, with the lowest of 88/50. He denies syncope, presyncope. He does have stable mild dyspnea, however, this has not changed in the last year.  Overall, he reports feeling well.  Other than low blood pressures, he has no concerns or complaints today.  Home Medications    Current Outpatient Medications  Medication Sig Dispense Refill   aspirin EC 81 MG EC tablet Take 1 tablet (81 mg total) by mouth daily. Swallow whole. 30 tablet 11   carvedilol (COREG) 6.25 MG tablet Take 1 tablet (6.25 mg total) by mouth 2 (two) times daily. 180 tablet 3   Cholecalciferol (VITAMIN D3 PO) Take 1 tablet by mouth daily.     irbesartan (AVAPRO) 150 MG tablet Take 1 tablet (150 mg total) by mouth daily. 90 tablet 3   metFORMIN (GLUCOPHAGE) 500 MG tablet Take 1 tablet (500 mg total) by mouth 2 (  two) times daily with a meal. 60 tablet 11   Multiple Vitamin (MULTIVITAMIN) capsule Take 1 capsule by mouth daily.     omeprazole (PRILOSEC) 40 MG capsule Take 40 mg by mouth as needed.     Probiotic Product (PROBIOTIC-10 PO) Take 1 tablet by mouth daily.     rosuvastatin (CRESTOR) 40 MG tablet Take 1 tablet (40 mg total) by mouth daily. 90 tablet 3   No current facility-administered medications for this visit.     Review of Systems    He denies chest pain, palpitations, pnd,  orthopnea, n, v, syncope, edema, weight gain, or early satiety. All other systems reviewed and are otherwise negative except as noted above.   Physical Exam    VS:  BP 122/60 (BP Location: Left Arm, Patient Position: Sitting, Cuff Size: Normal)    Pulse 65    Ht 6' (1.829 m)    Wt 200 lb (90.7 kg)    BMI 27.12 kg/m  GEN: Well nourished, well developed, in no acute distress. HEENT: normal. Neck: Supple, no JVD, carotid bruits, or masses. Cardiac: RRR, 2/6 murmur, loudest at the R upper sternal border, no rubs, or gallops. No clubbing, cyanosis, edema. Radials/DP/PT 2+ and equal bilaterally.  Respiratory:  Respirations regular and unlabored, clear to auscultation bilaterally. GI: Soft, nontender, nondistended, BS + x 4. MS: no deformity or atrophy. Skin: warm and dry, no rash. Neuro:  Strength and sensation are intact. Psych: Normal affect.  Accessory Clinical Findings    ECG personally reviewed by me today - NSR- no acute changes.  Lab Results  Component Value Date   WBC 7.5 09/19/2020   HGB 15.6 09/19/2020   HCT 46.7 09/19/2020   MCV 89.3 09/19/2020   PLT 194 09/19/2020   Lab Results  Component Value Date   CREATININE 1.12 09/19/2020   BUN 19 09/19/2020   NA 139 09/19/2020   K 4.2 09/19/2020   CL 107 09/19/2020   CO2 20 (L) 09/19/2020   Lab Results  Component Value Date   ALT 31 09/19/2020   AST 25 09/19/2020   ALKPHOS 57 09/19/2020   BILITOT 1.0 09/19/2020   Lab Results  Component Value Date   CHOL 270 (H) 09/19/2020   HDL 33 (L) 09/19/2020   LDLCALC 201 (H) 09/19/2020   TRIG 179 (H) 09/19/2020   CHOLHDL 8.2 09/19/2020    Lab Results  Component Value Date   HGBA1C 6.4 (H) 09/19/2020    Assessment & Plan    1. Moderate CAD/Aortic atherosclerosis/AV sclerosis/Mild-moderate MR: Recent routine CT chest for lung cancer screening that showed aortic atherosclerosis, left main and three-vessel CAD, as well as severe calcifications of the aortic valve and mitral  annulus.  These findings are consistent with prior coronary CTA with FFR in 2021, which revealed a calcium score of 2111, 95th percentile for age and sex, as well as plaque in the LM, LAD, LCx, and RCA, though FFR hemodynamically significant only in the distal LAD, likely due to diffuse disease in the small caliber distal LAD.  This has been managed medically. Echocardiogram in 2021 showed an EF of 65 to 70%, no R WMA, mild LVH, G1 DD, mild-moderate MR, moderate to severe mitral annular calcification, mild aortic valve sclerosis with no evidence of stenosis.  He does have a new murmur not previously documented on exam today. Though he is seemingly asymptomatic, will repeat echocardiogram to assess for any worsening valvular abnormalities given new murmur on exam today.  Continue aspirin, carvedilol  as below, irbesartan and rosuvastatin.  2. Hypertension/possible orthostatic hypotension: He checks his blood pressure consistently at home each morning before taking his medication.  His readings are generally in the 120s over 70s. However, he does report occasional low blood pressure readings in the setting of dizziness, lightheadedness primarily with positional changes. Symptoms are consistent with orthostatic hypotension. Discussed hydration, slow positional changes, thigh sleeves and abdominal binder.  Additionally, I will decrease his carvedilol to 6.25 mg twice daily.  He will monitor blood pressure at home 2 hours following medication, and report BP consistently > 130/80 or SBP consistently <110.  If symptoms persist, could consider further reduction of blood pressure medication.  Otherwise, continue current antihypertensive regimen.   3. Hyperlipidemia: LDL 63 on 03/2021. Managed and monitored by PCP.  Continue aspirin, rosuvastatin.  4. Grade 1 diastolic dysfunction: Euvolemic and well compensated on exam.  Continue medications as above.  5. Leg cramps: He reports an increase in nocturnal leg cramps.   These awaken him from his sleep and occur almost daily.  Of note, he takes omeprazole daily.  We will check BMET, Mg.  May take 200 to 400 mg of magnesium glycinate over-the-counter to see if this helps his leg cramps.  If he does not see any improvement in his symptoms and labs are stable, recommend he follow-up with his PCP.  6. Prediabetes: A1c was 6.3 on 04/06/2021.  Monitored and managed by PCP.  7. COPD/former tobacco use: Stable mild chronic dyspnea.  He is no longer smoking.  8. Disposition: Repeat echocardiogram.  Follow-up in 1 year or sooner if needed.  Lenna Sciara, NP 10/12/2021, 4:10 PM

## 2021-10-12 NOTE — Patient Instructions (Signed)
Medication Instructions:  Decrease Coreg to 6.25 mg ( 1 Tablet Twice Daily). *If you need a refill on your cardiac medications before your next appointment, please call your pharmacy*   Lab Work: BMET, Magnesium If you have labs (blood work) drawn today and your tests are completely normal, you will receive your results only by: [place] (if you have MyChart) OR A paper copy in the mail If you have any lab test that is abnormal or we need to change your treatment, we will call you to review the results.   Testing/Procedures: [address] [place] has requested that you have an echocardiogram. Echocardiography is a painless test that uses sound waves to create images of your heart. It provides your doctor with information about the size and shape of your heart and how well your hearts chambers and valves are working. This procedure takes approximately one hour. There are no restrictions for this procedure.    Follow-Up: At [facility], you and your health needs are our priority.  As part of our continuing mission to provide you with exceptional heart care, we have created designated Provider Care Teams.  These Care Teams include your primary Cardiologist (physician) and Advanced Practice Providers (APPs -  Physician Assistants and Nurse Practitioners) who all work together to provide you with the care you need, when you need it.  We recommend signing up for the patient portal called "MyChart".  Sign up information is provided on this After Visit Summary.  MyChart is used to connect with patients for Virtual Visits (Telemedicine).  Patients are able to view lab/test results, encounter notes, upcoming appointments, etc.  Non-urgent messages can be sent to your provider as well.   To learn more about what you can do with MyChart, go to [email or web address].    Your next appointment:   1 year(s)  The format for your next appointment:   In  Person  Provider:   Elouise Munroe, MD     Other Instructions Check Blood Pressure Daily. Blood Pressure consistently 130/80 or Below 110 Please call the office.

## 2021-10-13 LAB — BASIC METABOLIC PANEL
BUN/Creatinine Ratio: 21 (ref 10–24)
BUN: 23 mg/dL (ref 8–27)
CO2: 22 mmol/L (ref 20–29)
Calcium: 10.3 mg/dL — ABNORMAL HIGH (ref 8.6–10.2)
Chloride: 103 mmol/L (ref 96–106)
Creatinine, Ser: 1.09 mg/dL (ref 0.76–1.27)
Glucose: 133 mg/dL — ABNORMAL HIGH (ref 70–99)
Potassium: 4.5 mmol/L (ref 3.5–5.2)
Sodium: 140 mmol/L (ref 134–144)
eGFR: 73 mL/min/{1.73_m2} (ref >59)

## 2021-10-13 LAB — MAGNESIUM: Magnesium: 1.9 mg/dL (ref 1.6–2.3)

## 2021-10-15 ENCOUNTER — Encounter: Payer: Self-pay | Admitting: *Deleted

## 2021-10-29 ENCOUNTER — Other Ambulatory Visit: Payer: Self-pay

## 2021-10-29 ENCOUNTER — Ambulatory Visit (HOSPITAL_COMMUNITY): Payer: Medicare Other | Attending: Internal Medicine

## 2021-10-29 DIAGNOSIS — Z79899 Other long term (current) drug therapy: Secondary | ICD-10-CM | POA: Diagnosis not present

## 2021-10-29 DIAGNOSIS — I358 Other nonrheumatic aortic valve disorders: Secondary | ICD-10-CM | POA: Insufficient documentation

## 2021-10-29 LAB — ECHOCARDIOGRAM COMPLETE
Area-P 1/2: 1.74 cm2
MV VTI: 2.06 cm2
P 1/2 time: 1039 msec
S' Lateral: 2.5 cm

## 2021-10-29 MED ORDER — ROSUVASTATIN CALCIUM 40 MG PO TABS: 40.0000 mg | ORAL_TABLET | Freq: Every day | ORAL | 3 refills | Status: DC

## 2021-11-10 IMAGING — CR DG CHEST 2V
2 series · 2 of 2 positions shown · non-contrast
Comparison: 06/20/2017

CLINICAL DATA: Left-sided chest pain, short of breath, dizziness

EXAM:
CHEST - 2 VIEW

[w chest pa]
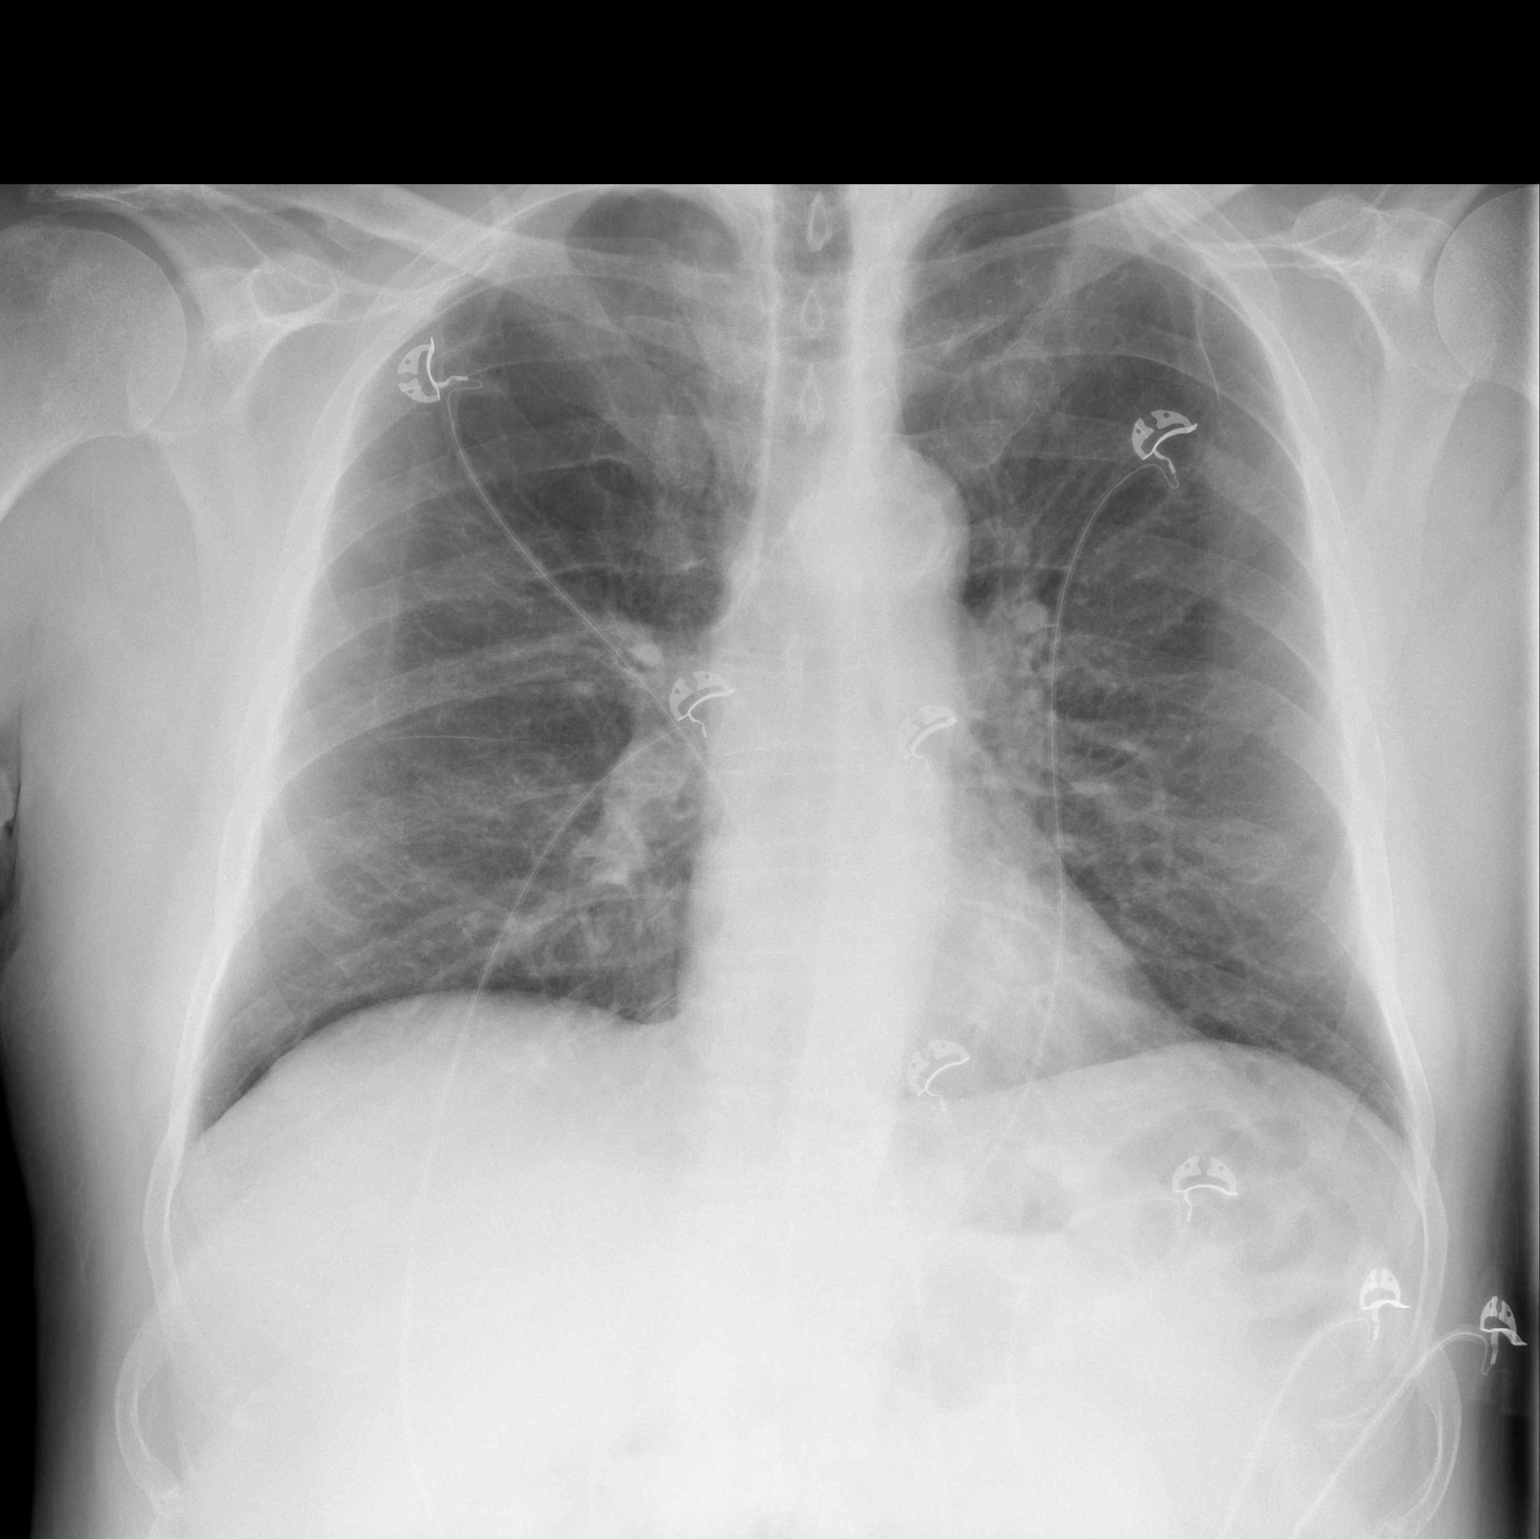

[w chest lat]
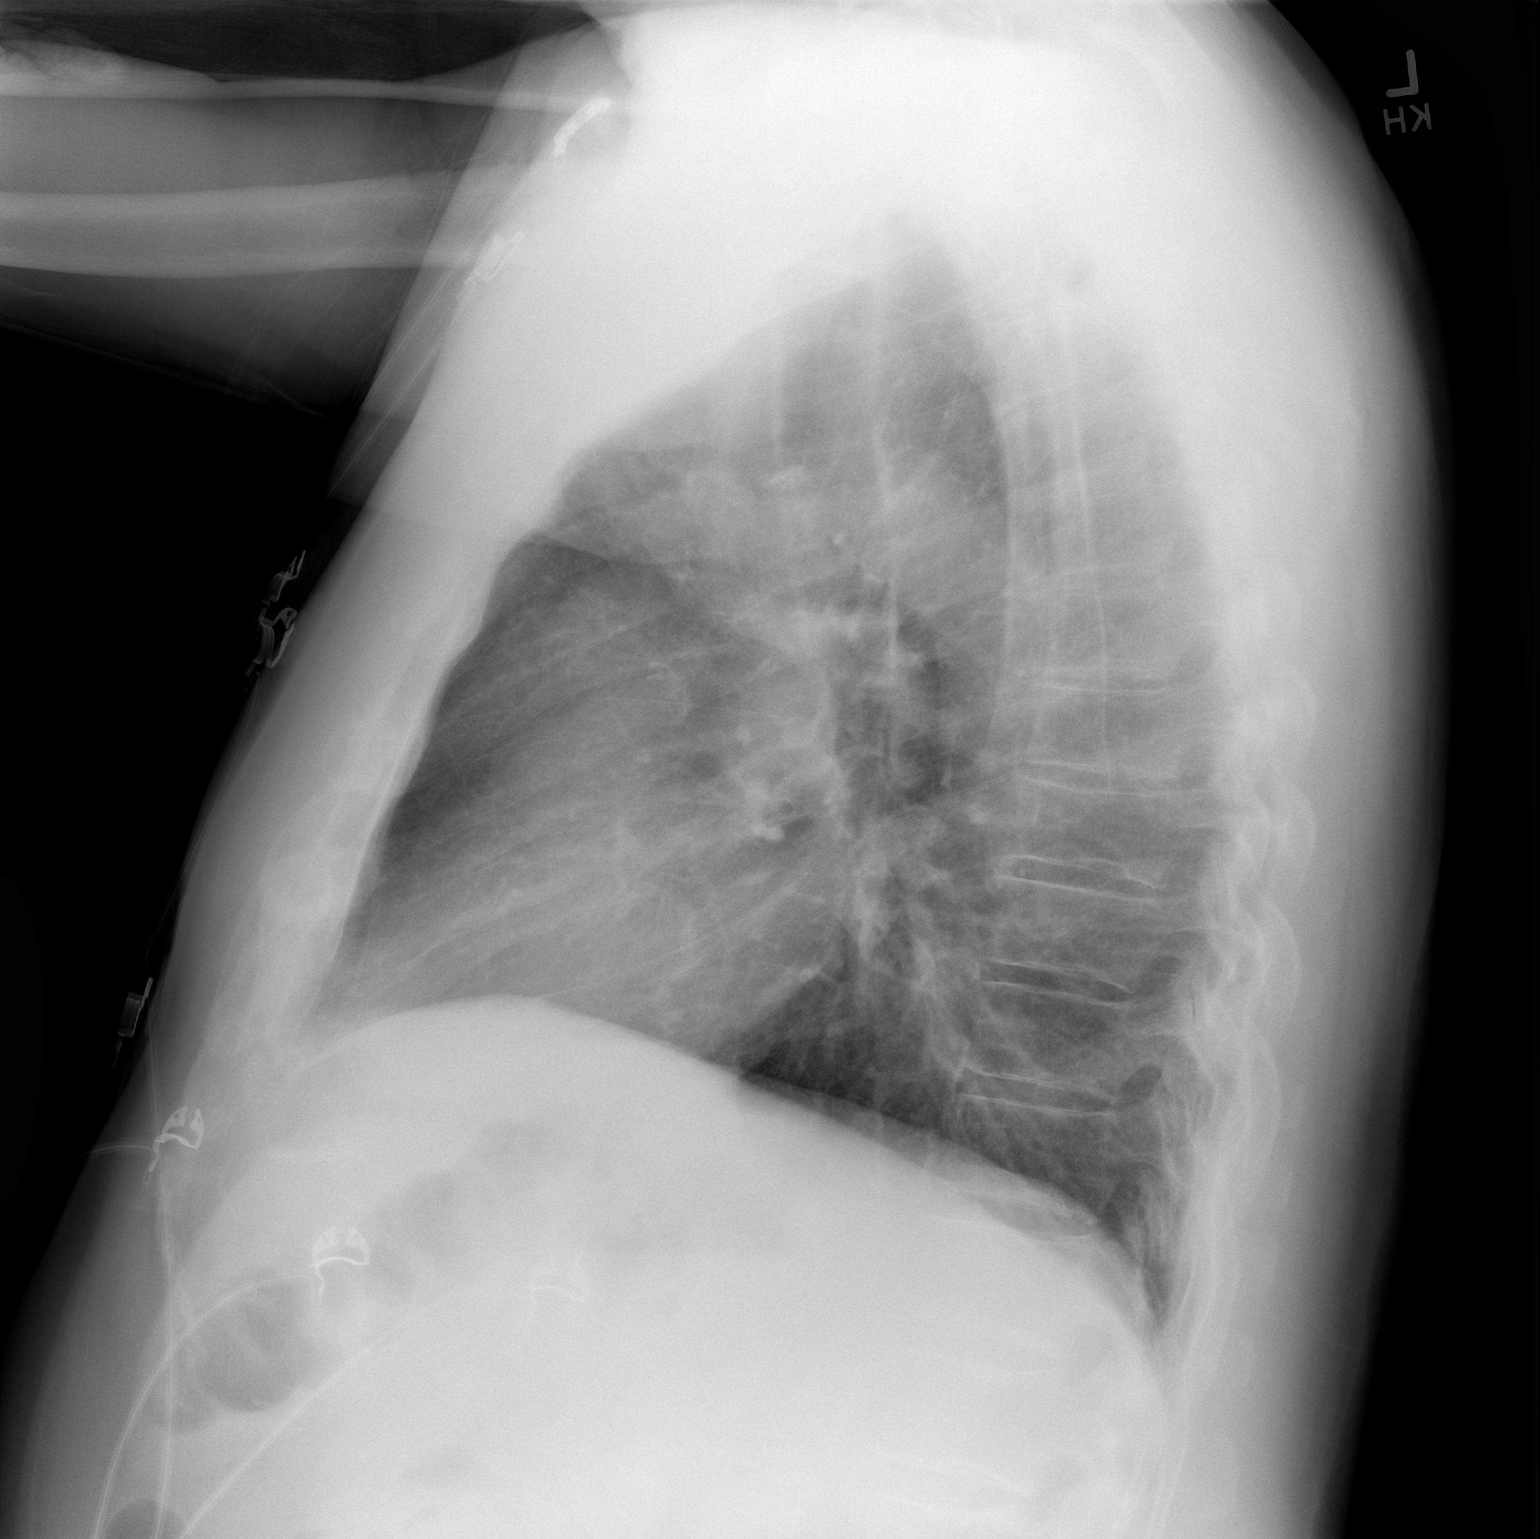

[2 of 2 positions shown; findings below may reference images not displayed]

FINDINGS: Frontal and lateral views of the chest demonstrate an unremarkable
cardiac silhouette. No airspace disease, effusion, or pneumothorax.
No acute bony abnormalities.
IMPRESSION: 1. No acute intrathoracic process.

## 2021-11-12 IMAGING — CT CT HEART MORP W/ CTA COR W/ SCORE W/ CA W/CM &/OR W/O CM
4 of 7 series · 8 of 20 positions shown, 9 images · IV contrast (omnipaque)
Comparison: None
COMPARISON: None

Addendum:
EXAM:
OVER-READ INTERPRETATION  CT CHEST

The following report is an over-read performed by radiologist Dr.
Del Jumper [[email or web address]] on 09/19/2020. This
over-read does not include interpretation of cardiac or coronary
anatomy or pathology. The coronary calcium score/coronary CTA
interpretation by the cardiologist is attached.
HISTORY: Chest pain, nonspecific
Cardiac/Coronary  CT
TECHNIQUE: The patient was scanned on a Siemens Force scanner.
PROTOCOL: A 120 kV prospective scan was triggered in the descending thoracic
aorta at 111 HU's. Axial non-contrast 3 mm slices were carried out
through the heart. The data set was analyzed on a dedicated work
station and scored using the Agatston method. Gantry rotation speed
was 250 msecs and collimation was .6 mm. Beta blockade and 0.8 mg of
sl NTG was given. The 3D data set was reconstructed in 5% intervals
of the 67-82 % of the R-R cycle. Diastolic phases were analyzed on a
dedicated work station using MPR, MIP and VRT modes. The patient
received 80mL OMNIPAQUE IOHEXOL 350 MG/ML SOLN of contrast.

[Series 6: best diast · axial · 0.39mm/px · z∈[+1256,+1296]mm · 2 of 298 slices shown, 3 images]
[im 100/298  vessel]
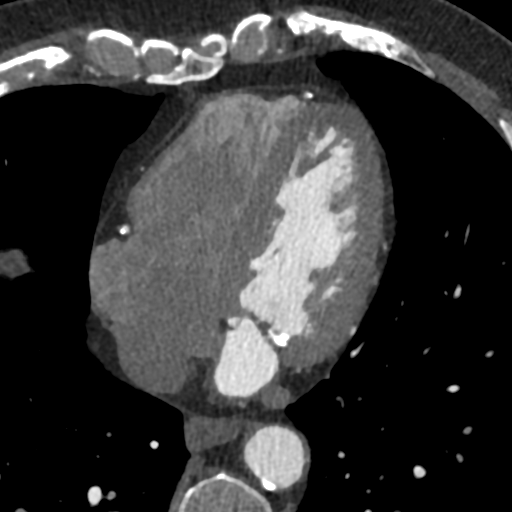
[im 100/298  lung]
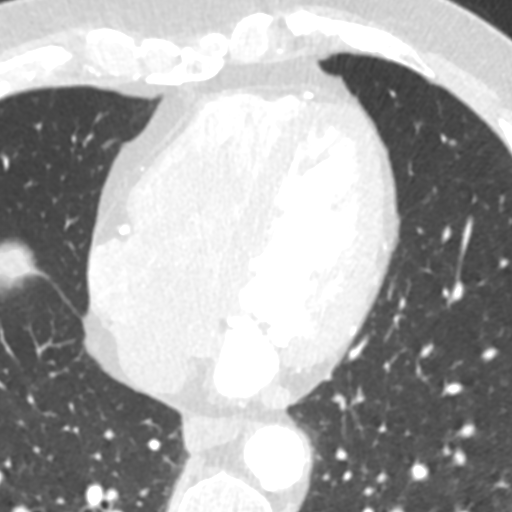
[im 199/298  vessel]
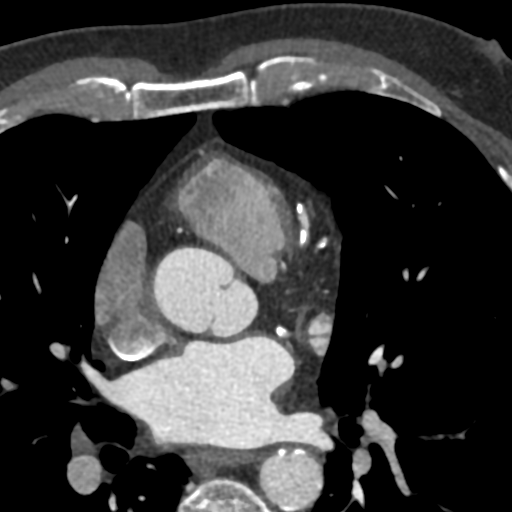

[Series 7: best syst · axial · 0.39mm/px · z∈[+1256,+1296]mm · 2 of 298 slices shown]
[im 100/298  vessel]
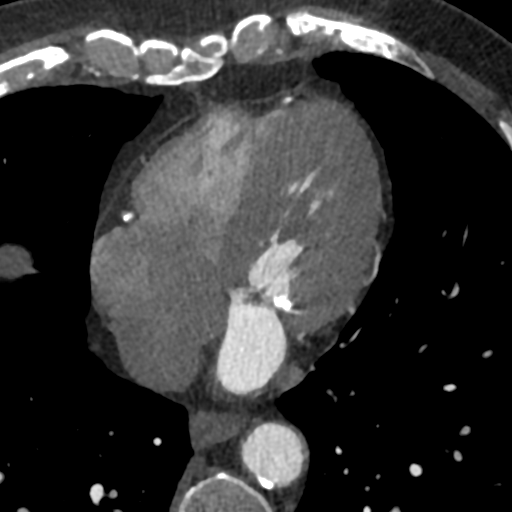
[im 199/298  vessel]
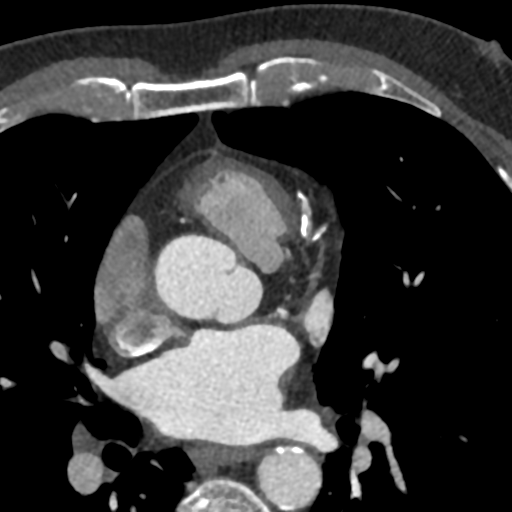

[Series 8: ts diast sharp 70 % · axial · 0.39mm/px · z∈[+1256,+1296]mm · 2 of 298 slices shown]
[im 100/298  lung]
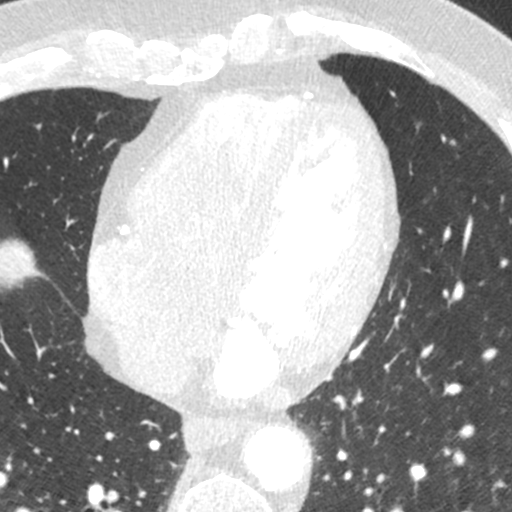
[im 199/298  lung]
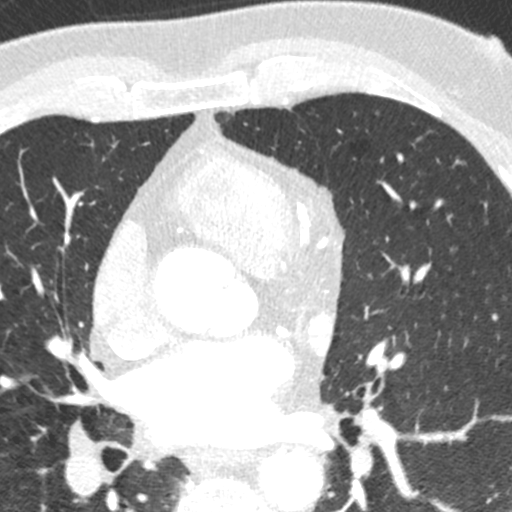

[Series 9: ts syst sharp · axial · 0.39mm/px · z∈[+1256,+1296]mm · 2 of 298 slices shown]
[im 100/298  lung]
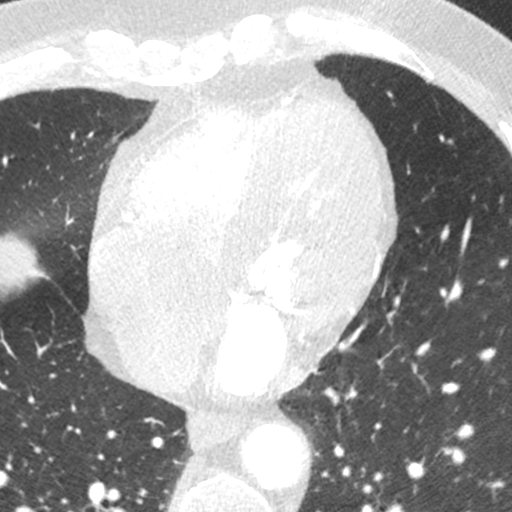
[im 199/298  lung]
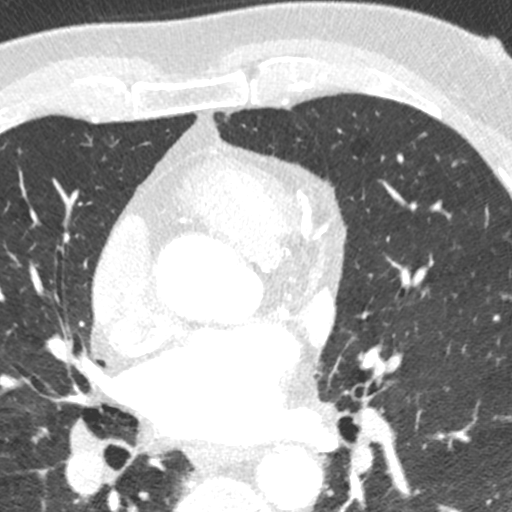

[8 of 20 positions shown; findings below may reference images not displayed]

FINDINGS: Vascular: Calcified atheromatous plaque of the thoracic aorta and
coronary artery calcifications, see dedicated CT angiography report
and coronary calcium scoring.

Mediastinum/Nodes: Limited visualization of mediastinal structures
without signs of adenopathy. Visualized esophagus is grossly normal.
No hilar adenopathy within the visualized portions of the hila
bilaterally.

Lungs/Pleura: Moderate pulmonary emphysema. No consolidation. No
pleural effusion. Lungs are incompletely imaged.

Small RIGHT upper lobe pulmonary nodule (image 17, series 12) 3 mm.

Small RIGHT middle lobe pulmonary nodule in the RIGHT
costodiaphragmatic recess 4 mm (image 33, series 12)

Upper Abdomen: No acute upper abdominal process.

Musculoskeletal: No acute bone finding or destructive bone process.
Spinal degenerative changes.
IMPRESSION: 1. Moderate pulmonary emphysema.
2. Small pulmonary nodules in the RIGHT chest as described largest 4
mm. Non-contrast chest CT can be considered in 12 months in this
high risk patient. This recommendation follows the consensus
statement: Guidelines for Management of Incidental Pulmonary Nodules
Detected on CT Images: From the [HOSPITAL] 0537; Radiology
0537; [DATE]. Is
3. Calcified atheromatous plaque of the thoracic aorta and coronary
artery calcifications, see dedicated CT angiography report and
coronary calcium scoring.

Aortic Atherosclerosis (3L1MS-9ZJ.J) and Emphysema (3L1MS-2C0.T).
FINDINGS: Image quality: good

Noise artifact is: Moderate, calcium blooming artifact.

Coronary calcium score is 8555, which places the patient in the 95th
percentile for age and sex matched control.

Coronary arteries: Normal coronary origins.  Right dominance.

Right Coronary Artery: Mild mixed atherosclerotic plaque in the
proximal RCA, 25-49% stenosis. Serial moderate mixed atherosclerotic
stenoses in the mid RCA, 50-69% stenosis. Mild mixed atherosclerotic
plaque in the distal RCA, 25-49% stenosis.

Left Main Coronary Artery: Minimal ostial left main mixed
atherosclerotic plaque, <25% stenosis. Mild mixed atherosclerotic
plaque in the distal left main coronary artery, 25-49% stenosis.

Left Anterior Descending Coronary Artery: Wrap around LAD. Moderate
proximal, mid, and distal LAD mixed atherosclerotic plaque, 50-69%
stenosis. The portion of the distal LAD that wraps around the LV
apex is diffusely diseased. Moderate mixed atherosclerotic plaque in
the first diagonal artery, 50-69% stenosis.

Left Circumflex Artery: Mild mixed atherosclerotic plaque in the
proximal L circumflex artery, 25-49% stenosis. After the second OM,
the distal L circumflex as two serial moderate mixed atherosclerotic
plaques, 50-69% stenosis.

Aorta: Normal size, 36 mm at the mid ascending aorta (level of the
PA bifurcation) measured double oblique. Mild calcifications. No
dissection.

Aortic Valve: Moderate calcifications.  AV calcium score 748.

Other findings:

Normal pulmonary vein drainage into the left atrium.

Normal left atrial appendage without a thrombus.

Normal size of the pulmonary artery.

Mild mitral annular calcifications.
IMPRESSION: 1. Moderate CAD, CADRADS = 3. CT FFR will be performed and reported
separately.

2. Coronary calcium score is 8555, which places the patient in the
95th percentile for age and sex matched control.

3. Normal coronary origin with right dominance.

*** End of Addendum ***
EXAM:
OVER-READ INTERPRETATION  CT CHEST

The following report is an over-read performed by radiologist Dr.
Del Jumper [[email or web address]] on 09/19/2020. This
over-read does not include interpretation of cardiac or coronary
anatomy or pathology. The coronary calcium score/coronary CTA
interpretation by the cardiologist is attached.
FINDINGS: Vascular: Calcified atheromatous plaque of the thoracic aorta and
coronary artery calcifications, see dedicated CT angiography report
and coronary calcium scoring.

Mediastinum/Nodes: Limited visualization of mediastinal structures
without signs of adenopathy. Visualized esophagus is grossly normal.
No hilar adenopathy within the visualized portions of the hila
bilaterally.

Lungs/Pleura: Moderate pulmonary emphysema. No consolidation. No
pleural effusion. Lungs are incompletely imaged.

Small RIGHT upper lobe pulmonary nodule (image 17, series 12) 3 mm.

Small RIGHT middle lobe pulmonary nodule in the RIGHT
costodiaphragmatic recess 4 mm (image 33, series 12)

Upper Abdomen: No acute upper abdominal process.

Musculoskeletal: No acute bone finding or destructive bone process.
Spinal degenerative changes.
IMPRESSION: 1. Moderate pulmonary emphysema.
2. Small pulmonary nodules in the RIGHT chest as described largest 4
mm. Non-contrast chest CT can be considered in 12 months in this
high risk patient. This recommendation follows the consensus
statement: Guidelines for Management of Incidental Pulmonary Nodules
Detected on CT Images: From the [HOSPITAL] 0537; Radiology
0537; [DATE]. Is
3. Calcified atheromatous plaque of the thoracic aorta and coronary
artery calcifications, see dedicated CT angiography report and
coronary calcium scoring.

Aortic Atherosclerosis (3L1MS-9ZJ.J) and Emphysema (3L1MS-2C0.T).

## 2021-12-04 DIAGNOSIS — E559 Vitamin D deficiency, unspecified: Secondary | ICD-10-CM | POA: Diagnosis not present

## 2021-12-04 DIAGNOSIS — R400 Somnolence: Secondary | ICD-10-CM | POA: Diagnosis not present

## 2021-12-04 DIAGNOSIS — K2100 Gastro-esophageal reflux disease with esophagitis, without bleeding: Secondary | ICD-10-CM | POA: Diagnosis not present

## 2021-12-04 DIAGNOSIS — I2510 Atherosclerotic heart disease of native coronary artery without angina pectoris: Secondary | ICD-10-CM | POA: Diagnosis not present

## 2021-12-04 DIAGNOSIS — E7800 Pure hypercholesterolemia, unspecified: Secondary | ICD-10-CM | POA: Diagnosis not present

## 2021-12-04 DIAGNOSIS — E1169 Type 2 diabetes mellitus with other specified complication: Secondary | ICD-10-CM | POA: Diagnosis not present

## 2021-12-04 DIAGNOSIS — I10 Essential (primary) hypertension: Secondary | ICD-10-CM | POA: Diagnosis not present

## 2022-01-07 DIAGNOSIS — Z0000 Encounter for general adult medical examination without abnormal findings: Secondary | ICD-10-CM | POA: Diagnosis not present

## 2022-01-07 DIAGNOSIS — Z1389 Encounter for screening for other disorder: Secondary | ICD-10-CM | POA: Diagnosis not present

## 2022-01-08 DIAGNOSIS — G4719 Other hypersomnia: Secondary | ICD-10-CM | POA: Diagnosis not present

## 2022-01-08 DIAGNOSIS — K2100 Gastro-esophageal reflux disease with esophagitis, without bleeding: Secondary | ICD-10-CM | POA: Diagnosis not present

## 2022-01-08 DIAGNOSIS — I10 Essential (primary) hypertension: Secondary | ICD-10-CM | POA: Diagnosis not present

## 2022-01-14 DIAGNOSIS — I10 Essential (primary) hypertension: Secondary | ICD-10-CM | POA: Diagnosis not present

## 2022-01-14 DIAGNOSIS — I2510 Atherosclerotic heart disease of native coronary artery without angina pectoris: Secondary | ICD-10-CM | POA: Diagnosis not present

## 2022-01-14 DIAGNOSIS — E7800 Pure hypercholesterolemia, unspecified: Secondary | ICD-10-CM | POA: Diagnosis not present

## 2022-01-14 DIAGNOSIS — K2100 Gastro-esophageal reflux disease with esophagitis, without bleeding: Secondary | ICD-10-CM | POA: Diagnosis not present

## 2022-01-31 DIAGNOSIS — E119 Type 2 diabetes mellitus without complications: Secondary | ICD-10-CM | POA: Diagnosis not present

## 2022-02-07 DIAGNOSIS — M25532 Pain in left wrist: Secondary | ICD-10-CM | POA: Diagnosis not present

## 2022-02-07 DIAGNOSIS — G4733 Obstructive sleep apnea (adult) (pediatric): Secondary | ICD-10-CM | POA: Diagnosis not present

## 2022-02-07 DIAGNOSIS — M654 Radial styloid tenosynovitis [de Quervain]: Secondary | ICD-10-CM | POA: Diagnosis not present

## 2022-02-07 DIAGNOSIS — I10 Essential (primary) hypertension: Secondary | ICD-10-CM | POA: Diagnosis not present

## 2022-02-07 DIAGNOSIS — K2100 Gastro-esophageal reflux disease with esophagitis, without bleeding: Secondary | ICD-10-CM | POA: Diagnosis not present

## 2022-02-15 DIAGNOSIS — M654 Radial styloid tenosynovitis [de Quervain]: Secondary | ICD-10-CM | POA: Diagnosis not present

## 2022-02-19 DIAGNOSIS — K5730 Diverticulosis of large intestine without perforation or abscess without bleeding: Secondary | ICD-10-CM | POA: Diagnosis not present

## 2022-02-19 DIAGNOSIS — K219 Gastro-esophageal reflux disease without esophagitis: Secondary | ICD-10-CM | POA: Diagnosis not present

## 2022-02-19 DIAGNOSIS — Z86010 Personal history of colonic polyps: Secondary | ICD-10-CM | POA: Diagnosis not present

## 2022-02-19 DIAGNOSIS — Z1211 Encounter for screening for malignant neoplasm of colon: Secondary | ICD-10-CM | POA: Diagnosis not present

## 2022-03-01 DIAGNOSIS — G4733 Obstructive sleep apnea (adult) (pediatric): Secondary | ICD-10-CM | POA: Diagnosis not present

## 2022-03-29 DIAGNOSIS — F332 Major depressive disorder, recurrent severe without psychotic features: Secondary | ICD-10-CM | POA: Diagnosis not present

## 2022-04-09 DIAGNOSIS — G4733 Obstructive sleep apnea (adult) (pediatric): Secondary | ICD-10-CM | POA: Diagnosis not present

## 2022-05-03 DIAGNOSIS — G4719 Other hypersomnia: Secondary | ICD-10-CM | POA: Diagnosis not present

## 2022-05-03 DIAGNOSIS — G4733 Obstructive sleep apnea (adult) (pediatric): Secondary | ICD-10-CM | POA: Diagnosis not present

## 2022-05-03 DIAGNOSIS — Z6828 Body mass index (BMI) 28.0-28.9, adult: Secondary | ICD-10-CM | POA: Diagnosis not present

## 2022-05-07 DIAGNOSIS — K2100 Gastro-esophageal reflux disease with esophagitis, without bleeding: Secondary | ICD-10-CM | POA: Diagnosis not present

## 2022-05-07 DIAGNOSIS — I10 Essential (primary) hypertension: Secondary | ICD-10-CM | POA: Diagnosis not present

## 2022-05-07 DIAGNOSIS — G4733 Obstructive sleep apnea (adult) (pediatric): Secondary | ICD-10-CM | POA: Diagnosis not present

## 2022-05-10 DIAGNOSIS — G4733 Obstructive sleep apnea (adult) (pediatric): Secondary | ICD-10-CM | POA: Diagnosis not present

## 2022-05-13 DIAGNOSIS — G4733 Obstructive sleep apnea (adult) (pediatric): Secondary | ICD-10-CM | POA: Diagnosis not present

## 2022-05-13 DIAGNOSIS — F330 Major depressive disorder, recurrent, mild: Secondary | ICD-10-CM | POA: Diagnosis not present

## 2022-05-13 DIAGNOSIS — G4719 Other hypersomnia: Secondary | ICD-10-CM | POA: Diagnosis not present

## 2022-05-16 DIAGNOSIS — G4733 Obstructive sleep apnea (adult) (pediatric): Secondary | ICD-10-CM | POA: Diagnosis not present

## 2022-05-22 DIAGNOSIS — K648 Other hemorrhoids: Secondary | ICD-10-CM | POA: Diagnosis not present

## 2022-05-22 DIAGNOSIS — K5730 Diverticulosis of large intestine without perforation or abscess without bleeding: Secondary | ICD-10-CM | POA: Diagnosis not present

## 2022-05-22 DIAGNOSIS — D125 Benign neoplasm of sigmoid colon: Secondary | ICD-10-CM | POA: Diagnosis not present

## 2022-05-22 DIAGNOSIS — K635 Polyp of colon: Secondary | ICD-10-CM | POA: Diagnosis not present

## 2022-05-22 DIAGNOSIS — Z1211 Encounter for screening for malignant neoplasm of colon: Secondary | ICD-10-CM | POA: Diagnosis not present

## 2022-05-22 DIAGNOSIS — Z86010 Personal history of colonic polyps: Secondary | ICD-10-CM | POA: Diagnosis not present

## 2022-06-07 ENCOUNTER — Ambulatory Visit
Admission: RE | Admit: 2022-06-07 | Discharge: 2022-06-07 | Disposition: A | Discharge status: Home / Self Care | Payer: Medicare Other | Source: Ambulatory Visit | Attending: Acute Care | Admitting: Acute Care

## 2022-06-07 DIAGNOSIS — Z87891 Personal history of nicotine dependence: Secondary | ICD-10-CM | POA: Diagnosis not present

## 2022-06-07 DIAGNOSIS — I2510 Atherosclerotic heart disease of native coronary artery without angina pectoris: Secondary | ICD-10-CM | POA: Diagnosis not present

## 2022-06-07 DIAGNOSIS — J432 Centrilobular emphysema: Secondary | ICD-10-CM | POA: Diagnosis not present

## 2022-06-07 DIAGNOSIS — J948 Other specified pleural conditions: Secondary | ICD-10-CM | POA: Diagnosis not present

## 2022-06-10 ENCOUNTER — Other Ambulatory Visit: Payer: Self-pay

## 2022-06-10 DIAGNOSIS — Z23 Encounter for immunization: Secondary | ICD-10-CM | POA: Diagnosis not present

## 2022-06-10 DIAGNOSIS — G4733 Obstructive sleep apnea (adult) (pediatric): Secondary | ICD-10-CM | POA: Diagnosis not present

## 2022-06-10 DIAGNOSIS — F330 Major depressive disorder, recurrent, mild: Secondary | ICD-10-CM | POA: Diagnosis not present

## 2022-06-10 DIAGNOSIS — Z87891 Personal history of nicotine dependence: Secondary | ICD-10-CM

## 2022-06-10 DIAGNOSIS — G4719 Other hypersomnia: Secondary | ICD-10-CM | POA: Diagnosis not present

## 2022-06-10 DIAGNOSIS — Z122 Encounter for screening for malignant neoplasm of respiratory organs: Secondary | ICD-10-CM

## 2022-07-09 DIAGNOSIS — F330 Major depressive disorder, recurrent, mild: Secondary | ICD-10-CM | POA: Diagnosis not present

## 2022-07-09 DIAGNOSIS — I2510 Atherosclerotic heart disease of native coronary artery without angina pectoris: Secondary | ICD-10-CM | POA: Diagnosis not present

## 2022-07-09 DIAGNOSIS — I10 Essential (primary) hypertension: Secondary | ICD-10-CM | POA: Diagnosis not present

## 2022-07-09 DIAGNOSIS — Z125 Encounter for screening for malignant neoplasm of prostate: Secondary | ICD-10-CM | POA: Diagnosis not present

## 2022-07-09 DIAGNOSIS — E7800 Pure hypercholesterolemia, unspecified: Secondary | ICD-10-CM | POA: Diagnosis not present

## 2022-07-09 DIAGNOSIS — E1169 Type 2 diabetes mellitus with other specified complication: Secondary | ICD-10-CM | POA: Diagnosis not present

## 2022-08-13 DIAGNOSIS — K2100 Gastro-esophageal reflux disease with esophagitis, without bleeding: Secondary | ICD-10-CM | POA: Diagnosis not present

## 2022-08-13 DIAGNOSIS — G4733 Obstructive sleep apnea (adult) (pediatric): Secondary | ICD-10-CM | POA: Diagnosis not present

## 2022-08-13 DIAGNOSIS — I10 Essential (primary) hypertension: Secondary | ICD-10-CM | POA: Diagnosis not present

## 2022-08-14 DIAGNOSIS — G4733 Obstructive sleep apnea (adult) (pediatric): Secondary | ICD-10-CM | POA: Diagnosis not present

## 2022-08-16 DIAGNOSIS — G4733 Obstructive sleep apnea (adult) (pediatric): Secondary | ICD-10-CM | POA: Diagnosis not present

## 2022-09-13 DIAGNOSIS — G4733 Obstructive sleep apnea (adult) (pediatric): Secondary | ICD-10-CM | POA: Diagnosis not present

## 2022-09-18 ENCOUNTER — Other Ambulatory Visit: Payer: Self-pay | Admitting: Internal Medicine

## 2022-09-18 ENCOUNTER — Other Ambulatory Visit: Payer: Self-pay | Admitting: Nurse Practitioner

## 2022-10-18 ENCOUNTER — Other Ambulatory Visit: Payer: Self-pay | Admitting: Nurse Practitioner

## 2022-10-21 ENCOUNTER — Other Ambulatory Visit: Payer: Self-pay

## 2022-10-21 ENCOUNTER — Emergency Department (HOSPITAL_BASED_OUTPATIENT_CLINIC_OR_DEPARTMENT_OTHER)
Admission: EM | Admit: 2022-10-21 | Discharge: 2022-10-22 | Disposition: A | Discharge status: Home / Self Care | Payer: Medicare Other | Attending: Emergency Medicine | Admitting: Emergency Medicine

## 2022-10-21 ENCOUNTER — Emergency Department (HOSPITAL_BASED_OUTPATIENT_CLINIC_OR_DEPARTMENT_OTHER): Payer: Medicare Other

## 2022-10-21 ENCOUNTER — Emergency Department (HOSPITAL_COMMUNITY): Payer: Medicare Other

## 2022-10-21 ENCOUNTER — Encounter (HOSPITAL_BASED_OUTPATIENT_CLINIC_OR_DEPARTMENT_OTHER): Payer: Self-pay

## 2022-10-21 DIAGNOSIS — I10 Essential (primary) hypertension: Secondary | ICD-10-CM | POA: Insufficient documentation

## 2022-10-21 DIAGNOSIS — I6782 Cerebral ischemia: Secondary | ICD-10-CM | POA: Diagnosis not present

## 2022-10-21 DIAGNOSIS — Z7982 Long term (current) use of aspirin: Secondary | ICD-10-CM | POA: Insufficient documentation

## 2022-10-21 DIAGNOSIS — J3489 Other specified disorders of nose and nasal sinuses: Secondary | ICD-10-CM | POA: Diagnosis not present

## 2022-10-21 DIAGNOSIS — R410 Disorientation, unspecified: Secondary | ICD-10-CM | POA: Diagnosis not present

## 2022-10-21 DIAGNOSIS — Z79899 Other long term (current) drug therapy: Secondary | ICD-10-CM | POA: Insufficient documentation

## 2022-10-21 DIAGNOSIS — Z7984 Long term (current) use of oral hypoglycemic drugs: Secondary | ICD-10-CM | POA: Insufficient documentation

## 2022-10-21 DIAGNOSIS — I639 Cerebral infarction, unspecified: Secondary | ICD-10-CM | POA: Diagnosis not present

## 2022-10-21 DIAGNOSIS — R531 Weakness: Secondary | ICD-10-CM | POA: Diagnosis not present

## 2022-10-21 DIAGNOSIS — I6381 Other cerebral infarction due to occlusion or stenosis of small artery: Secondary | ICD-10-CM | POA: Diagnosis not present

## 2022-10-21 DIAGNOSIS — R29818 Other symptoms and signs involving the nervous system: Secondary | ICD-10-CM | POA: Diagnosis not present

## 2022-10-21 LAB — CBC
HCT: 43.1 % (ref 39.0–52.0)
Hemoglobin: 14.1 g/dL (ref 13.0–17.0)
MCH: 29 pg (ref 26.0–34.0)
MCHC: 32.7 g/dL (ref 30.0–36.0)
MCV: 88.5 fL (ref 80.0–100.0)
Platelets: 296 10*3/uL (ref 150–400)
RBC: 4.87 MIL/uL (ref 4.22–5.81)
RDW: 12.3 % (ref 11.5–15.5)
WBC: 7.1 10*3/uL (ref 4.0–10.5)
nRBC: 0 % (ref 0.0–0.2)

## 2022-10-21 LAB — COMPREHENSIVE METABOLIC PANEL
ALT: 19 U/L (ref 0–44)
AST: 18 U/L (ref 15–41)
Albumin: 4.7 g/dL (ref 3.5–5.0)
Alkaline Phosphatase: 62 U/L (ref 38–126)
Anion gap: 12 (ref 5–15)
BUN: 23 mg/dL (ref 8–23)
CO2: 22 mmol/L (ref 22–32)
Calcium: 10.1 mg/dL (ref 8.9–10.3)
Chloride: 102 mmol/L (ref 98–111)
Creatinine, Ser: 1.09 mg/dL (ref 0.61–1.24)
GFR, Estimated: >60 mL/min (ref >60)
Glucose, Bld: 130 mg/dL — ABNORMAL HIGH (ref 70–99)
Potassium: 4.1 mmol/L (ref 3.5–5.1)
Sodium: 136 mmol/L (ref 135–145)
Total Bilirubin: 0.4 mg/dL (ref 0.3–1.2)
Total Protein: 8 g/dL (ref 6.5–8.1)

## 2022-10-21 LAB — CBG MONITORING, ED: Glucose-Capillary: 152 mg/dL — ABNORMAL HIGH (ref 70–99)

## 2022-10-21 LAB — DIFFERENTIAL
Abs Immature Granulocytes: 0.03 10*3/uL (ref 0.00–0.07)
Basophils Absolute: 0 10*3/uL (ref 0.0–0.1)
Basophils Relative: 0 %
Eosinophils Absolute: 0.1 10*3/uL (ref 0.0–0.5)
Eosinophils Relative: 2 %
Immature Granulocytes: 0 %
Lymphocytes Relative: 18 %
Lymphs Abs: 1.3 10*3/uL (ref 0.7–4.0)
Monocytes Absolute: 0.4 10*3/uL (ref 0.1–1.0)
Monocytes Relative: 6 %
Neutro Abs: 5.3 10*3/uL (ref 1.7–7.7)
Neutrophils Relative %: 74 %

## 2022-10-21 LAB — PROTIME-INR
INR: 0.9 (ref 0.8–1.2)
Prothrombin Time: 12.5 seconds (ref 11.4–15.2)

## 2022-10-21 LAB — APTT: aPTT: 33 seconds (ref 24–36)

## 2022-10-21 LAB — ETHANOL: Alcohol, Ethyl (B): <10 mg/dL (ref <10)

## 2022-10-21 NOTE — ED Provider Notes (Signed)
Patient transferred from [place] for MRI.  Neurorecommendations were MRI and then discharge if negative.  Patient is asymptomatic and he tells me that he had bilateral leg weakness and feels like he was confused though he also remembers the situation for a couple hours.  Given the bilateral leg weakness a TIA seems less likely.  MRI pending. Care transferred to Dr. Betsey Holiday with final disposition pending.    Sherwood Gambler, MD 10/22/22 [phone]

## 2022-10-21 NOTE — ED Notes (Signed)
[facility] [place] [facility] [place] [facility] MRI to rule out a stroke. He arrived with [facility]. [facility] reports he is Alert and Oriented x4.

## 2022-10-21 NOTE — ED Provider Triage Note (Signed)
Emergency Medicine Provider Triage Evaluation Note  TEVION LAFORGE , a 72 y.o. male  was evaluated in triage.  Pt complains of leg weakness and altered  yesterday at 4pm  Pt reports symptoms improved today   Review of Systems  Positive: weakness Negative: Fever   Physical Exam  BP (!) 183/91 (BP Location: Right Arm)   Pulse 87   Temp 98.1 F (36.7 C)   Resp 16   Ht '6\' 2"'$  (1.88 m)   Wt 90.7 kg   SpO2 98%   BMI 25.67 kg/m  Gen:   Awake, no distress   Resp:  Normal effort  MSK:   Moves extremities without difficulty  Other:    Medical Decision Making  Medically screening exam initiated at 3:29 PM.  Appropriate orders placed.  Uthman Mroczkowski Lovvorn was informed that the remainder of the evaluation will be completed by another provider, this initial triage assessment does not replace that evaluation, and the importance of remaining in the ED until their evaluation is complete.     Fransico Meadow, [place] 10/21/22 1530

## 2022-10-21 NOTE — ED Notes (Signed)
Patient transported to MRI 

## 2022-10-21 NOTE — ED Triage Notes (Signed)
Patient here POV from Home.  Endorses being at rest watching football yesterday at 1600 when he had a sudden onset of disorientation. Symptoms of Confusion subsided after 2 Hours. Endorses No Symptoms but Fatigue since.  No Changes in Vision. No Headache. No Anticoagulants. No Pain. No New SOB.   NAD Noted during Triage. A&Ox4. GCS 15. Ambulatory.

## 2022-10-21 NOTE — ED Provider Notes (Signed)
[facility] Provider Note   CSN: [id number] Arrival date & time: 10/21/22  1508     History  Chief Complaint  Patient presents with   Disorientation    Justin Waters is a 72 y.o. male.  HPI   72 year old male with past medical history significant for bipolar disorder, GERD, HTN who presents to the emergency department with concern for stroke.  The patient states that around 4 PM yesterday he was watching football when he suddenly experienced an onset of disorientation.  He felt confused.  Initially had reported isolated leg weakness in triage but states that his symptoms were more consistent with generalized weakness without focality.  Symptoms lasted for around 2 hours and subsequently resolved.  Due to his concern for possible stroke, he presents to the emergency department for evaluation today.  He denies any vision changes.  He denies any headache.  He is not on anticoagulation.  He denies any current pain.  He denies any focal deficits.  He denies any difficulty with ambulation.  Home Medications Prior to Admission medications   Medication Sig Start Date End Date Taking? Authorizing Provider  aspirin EC 81 MG EC tablet Take 1 tablet (81 mg total) by mouth daily. Swallow whole. 09/21/20   Debbe Odea, MD  carvedilol (COREG) 6.25 MG tablet TAKE 1 TABLET(6.25 MG) BY MOUTH TWICE DAILY 09/18/22   Elouise Munroe, MD  Cholecalciferol (VITAMIN D3 PO) Take 1 tablet by mouth daily.    [provider]  irbesartan (AVAPRO) 150 MG tablet Take 1 tablet (150 mg total) by mouth daily. 10/10/20   Erlene Quan, PA-C  metFORMIN (GLUCOPHAGE) 500 MG tablet Take 1 tablet (500 mg total) by mouth 2 (two) times daily with a meal. 09/20/20 10/12/21  Debbe Odea, MD  Multiple Vitamin (MULTIVITAMIN) capsule Take 1 capsule by mouth daily.    [provider]  omeprazole (PRILOSEC) 40 MG capsule Take 40 mg by mouth as needed. 09/07/20    [provider]  Probiotic Product (PROBIOTIC-10 PO) Take 1 tablet by mouth daily.    [provider]  rosuvastatin (CRESTOR) 40 MG tablet TAKE 1 TABLET(40 MG) BY MOUTH DAILY 10/18/22   Lenna Sciara, NP      Allergies    Patient has no known allergies.    Review of Systems   Review of Systems  All other systems reviewed and are negative.   Physical Exam Updated Vital Signs BP (!) 138/93   Pulse 70   Temp 98.4 F (36.9 C)   Resp 18   Ht '6\' 2"'$  (1.88 m)   Wt 90.7 kg   SpO2 95%   BMI 25.67 kg/m  Physical Exam Vitals and nursing note reviewed.  Constitutional:      General: He is not in acute distress.    Appearance: He is well-developed.  HENT:     Head: Normocephalic and atraumatic.  Eyes:     Conjunctiva/sclera: Conjunctivae normal.  Cardiovascular:     Rate and Rhythm: Normal rate and regular rhythm.  Pulmonary:     Effort: Pulmonary effort is normal. No respiratory distress.     Breath sounds: Normal breath sounds.  Abdominal:     Palpations: Abdomen is soft.     Tenderness: There is no abdominal tenderness.  Musculoskeletal:        General: No swelling.     Cervical back: Neck supple.  Skin:    General: Skin is warm and dry.  Capillary Refill: Capillary refill takes less than 2 seconds.  Neurological:     Mental Status: He is alert.     Comments: MENTAL STATUS EXAM:    Orientation: Alert and oriented to person, place and time.  Memory: Cooperative, follows commands well.  Language: Speech is clear and language is normal.   CRANIAL NERVES:    CN 2 (Optic): Visual fields intact to confrontation.  CN 3,4,6 (EOM): Pupils equal and reactive to light. Full extraocular eye movement without nystagmus.  CN 5 (Trigeminal): Facial sensation is normal, no weakness of masticatory muscles.  CN 7 (Facial): No facial weakness or asymmetry.  CN 8 (Auditory): Auditory acuity grossly normal.  CN 9,10 (Glossophar): The uvula is midline, the palate  elevates symmetrically.  CN 11 (spinal access): Normal sternocleidomastoid and trapezius strength.  CN 12 (Hypoglossal): The tongue is midline. No atrophy or fasciculations.Marland Kitchen   MOTOR:  Muscle Strength: 5/5RUE, 5/5LUE, 5/5RLE, 5/5LLE   COORDINATION:   Intact finger-to-nose, no tremor, no pronator drift.   SENSATION:   Intact to light touch all four extremities.  GAIT: Gait normal without ataxia   Psychiatric:        Mood and Affect: Mood normal.     ED Results / Procedures / Treatments   Labs (all labs ordered are listed, but only abnormal results are displayed) Labs Reviewed  COMPREHENSIVE METABOLIC PANEL - Abnormal; Notable for the following components:      Result Value   Glucose, Bld 130 (*)    All other components within normal limits  CBG MONITORING, ED - Abnormal; Notable for the following components:   Glucose-Capillary 152 (*)    All other components within normal limits  PROTIME-INR  APTT  CBC  DIFFERENTIAL  ETHANOL    EKG EKG Interpretation  Date/Time:  Monday October 21 2022 15:33:58 EST Ventricular Rate:  76 PR Interval:  168 QRS Duration: 82 QT Interval:  386 QTC Calculation: 434 R Axis:   43 Text Interpretation: Normal sinus rhythm Cannot rule out Anterior infarct , age undetermined Abnormal ECG When compared with ECG of 17-Sep-2020 20:27, PREVIOUS ECG IS PRESENT Confirmed by Regan Lemming ([id number]) on 10/21/2022 3:39:52 PM  Radiology MR BRAIN WO CONTRAST  Result Date: 10/22/2022 CLINICAL DATA:  Initial evaluation for neuro deficit, stroke suspected. EXAM: MRI HEAD WITHOUT CONTRAST TECHNIQUE: Multiplanar, multiecho pulse sequences of the brain and surrounding structures were obtained without intravenous contrast. COMPARISON:  CT from earlier the same day. FINDINGS: Brain: Cerebral volume within normal limits. Scattered patchy T2/FLAIR hyperintensity involving the supratentorial cerebral white matter, nonspecific, but most commonly related to chronic  microvascular ischemic disease. Appearance is mild for age. No evidence for acute or subacute ischemia. Gray-white matter differentiation maintained. No areas of chronic cortical infarction. No acute or chronic intracranial blood products. No mass lesion, midline shift or mass effect. No hydrocephalus or extra-axial fluid collection. Pituitary gland and suprasellar region within normal limits. Vascular: Major intracranial vascular flow voids are maintained. Skull and upper cervical spine: Craniocervical junction normal. Bone marrow signal intensity within normal limits. No scalp soft tissue abnormality. Sinuses/Orbits: Globes orbital soft tissues within normal limits. Scattered mucosal thickening noted about the visualized paranasal sinuses. No mastoid effusion. Other: None. IMPRESSION: 1. No acute intracranial abnormality. 2. Mild chronic microvascular ischemic disease for age. Electronically Signed   By: Jeannine Boga M.D.   On: 10/22/2022 00:28   CT HEAD WO CONTRAST  Result Date: 10/21/2022 CLINICAL DATA:  Neuro deficit, acute, stroke suspected. Transient confusion yesterday  with fatigue since. EXAM: CT HEAD WITHOUT CONTRAST TECHNIQUE: Contiguous axial images were obtained from the base of the skull through the vertex without intravenous contrast. RADIATION DOSE REDUCTION: This exam was performed according to the departmental dose-optimization program which includes automated exposure control, adjustment of the mA and/or kV according to patient size and/or use of iterative reconstruction technique. COMPARISON:  None Available. FINDINGS: Brain: No acute hemorrhage, mass effect or midline shift. Small focus of hypoattenuation in the left anterior limb of the internal capsule, favored to represent age-indeterminate lacunar infarct. No hydrocephalus. No extra-axial collection. Basilar cisterns are patent. Vascular: No hyperdense vessel or unexpected calcification. Skull: No calvarial fracture or suspicious  bone lesion. Skull base is unremarkable. Sinuses/Orbits: Paranasal sinuses, mastoid air cells, and middle ear cavities are well aerated. Orbits are unremarkable. Other: None. 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 No acute intracranial hemorrhage. Electronically Signed   By: Emmit Alexanders M.D.   On: 10/21/2022 15:46    Procedures Procedures    Medications Ordered in ED Medications - No data to display  ED Course/ Medical Decision Making/ A&P                             Medical Decision Making Amount and/or Complexity of Data Reviewed Labs: ordered. Radiology: ordered.    72 year old male with past medical history significant for bipolar disorder, GERD, HTN who presents to the emergency department with concern for stroke.  The patient states that around 4 PM yesterday he was watching football when he suddenly experienced an onset of disorientation.  He felt confused.  Initially had reported isolated leg weakness in triage but states that his symptoms were more consistent with generalized weakness without focality.  Symptoms lasted for around 2 hours and subsequently resolved.  Due to his concern for possible stroke, he presents to the emergency department for evaluation today.  He denies any vision changes.  He denies any headache.  He is not on anticoagulation.  He denies any current pain.  He denies any focal deficits.  He denies any difficulty with ambulation.  On arrival, the patient was vitally stable, mildly hypertensive BP 183/91, neurologically intact.  Symptoms concerning for possible TIA yesterday.  CBG on arrival was notably 152, remainder of laboratory workup was unremarkable.  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 No acute intracranial hemorrhage.    I discussed the patient with on-call neurology, Dr.  Malen Gauze who recommended ER to ER transfer to [facility] for MRI imaging.  If MRI negative, patient can be discharged.  Dr. Pearline Cables was contacted and accepted the patient in ER to ER transfer.  Patient subsequently transferred in stable condition.    Final Clinical Impression(s) / ED Diagnoses Final diagnoses:  Disorientation    Rx / DC Orders ED Discharge Orders     None         Regan Lemming, MD 10/22/22 1324

## 2022-10-21 NOTE — Plan of Care (Addendum)
8:20 PM Received a call from ED provider at [facility].  Patient with episode of confusion as well as right leg weakness and a CT head with age-indeterminate lacunar infarction.  ABCD2 score 4.  Recommended admission to hospitalist for stroke/TIA workup to include MRI of the brain, CTA head and neck, A1c, lipid panel, telemetry. Last known well 28 hours prior to this call was received-outside the window for any sort of intervention. Please call inpatient neurology when the patient arrives to the hospital for formal consultation.   Addendum 8:33 PM ED provider called me later and said that the patient reports generalized leg weakness bilaterally with no focality. Symptoms do not look like stroke or TIA but given risk factors, MRI of the brain should be done to rule out lacunar infarct. Recommend ED to ED transfer for MRI.  If MRI is positive for stroke, please call inpatient neurology for formal consultation and further workup.  Otherwise can follow with primary care provider as outpatient. -- Amie Portland, MD Neurologist [facility] Pager: [phone]

## 2022-10-22 NOTE — ED Provider Notes (Signed)
Patient signed out to me with MRI pending.  Patient had an episode of disorientation and confusion earlier today that lasted approximately 2 hours.  Patient back to his baseline.  Workup to this point has been unremarkable.  MRI has been performed and now has been read.  No evidence of stroke.  Patient to be discharged per neurology, no further workup.   Orpah Greek, MD 10/22/22 [phone]

## 2022-11-11 ENCOUNTER — Ambulatory Visit
Admission: EM | Admit: 2022-11-11 | Discharge: 2022-11-11 | Disposition: A | Discharge status: Home / Self Care | Payer: Medicare Other

## 2022-11-11 DIAGNOSIS — H60392 Other infective otitis externa, left ear: Secondary | ICD-10-CM

## 2022-11-11 DIAGNOSIS — H65192 Other acute nonsuppurative otitis media, left ear: Secondary | ICD-10-CM

## 2022-11-11 MED ORDER — NEOMYCIN-POLYMYXIN-HC 3.5-10000-1 OT SUSP: 4.0000 [drp] | Freq: Three times a day (TID) | OTIC | 0 refills | Status: DC

## 2022-11-11 MED ORDER — AMOXICILLIN 875 MG PO TABS: 875.0000 mg | ORAL_TABLET | Freq: Two times a day (BID) | ORAL | 0 refills | Status: DC

## 2022-11-11 NOTE — ED Triage Notes (Signed)
Pt c/o left ear pain x 2-3 days-NAD-steady gait

## 2022-11-11 NOTE — ED Provider Notes (Signed)
[facility]  Note:  This document was prepared using Systems analyst and may include unintentional dictation errors.  MRN: [id number] DOB: [birth year]  Subjective:   Justin Waters is a 72 y.o. male presenting for 3-day history of acute onset persistent left ear pain, left ear fullness, left ear ringing.  No fever, ear drainage, runny or stuffy nose, sore throat, cough.  No current facility-administered medications for this encounter.  Current Outpatient Medications:    buPROPion (WELLBUTRIN XL) 150 MG 24 hr tablet, Take 150 mg by mouth daily., Disp: , Rfl:    aspirin EC 81 MG EC tablet, Take 1 tablet (81 mg total) by mouth daily. Swallow whole., Disp: 30 tablet, Rfl: 11   carvedilol (COREG) 6.25 MG tablet, TAKE 1 TABLET(6.25 MG) BY MOUTH TWICE DAILY, Disp: 60 tablet, Rfl: 1   Cholecalciferol (VITAMIN D3 PO), Take 1 tablet by mouth daily., Disp: , Rfl:    irbesartan (AVAPRO) 150 MG tablet, Take 1 tablet (150 mg total) by mouth daily., Disp: 90 tablet, Rfl: 3   metFORMIN (GLUCOPHAGE) 500 MG tablet, Take 1 tablet (500 mg total) by mouth 2 (two) times daily with a meal., Disp: 60 tablet, Rfl: 11   Multiple Vitamin (MULTIVITAMIN) capsule, Take 1 capsule by mouth daily., Disp: , Rfl:    omeprazole (PRILOSEC) 40 MG capsule, Take 40 mg by mouth as needed., Disp: , Rfl:    Probiotic Product (PROBIOTIC-10 PO), Take 1 tablet by mouth daily., Disp: , Rfl:    rosuvastatin (CRESTOR) 40 MG tablet, TAKE 1 TABLET(40 MG) BY MOUTH DAILY, Disp: 90 tablet, Rfl: 0   No Known Allergies  Past Medical History:  Diagnosis Date   Arthritis    Bipolar 1 disorder ([place])    ?? off meds 2012   GERD (gastroesophageal reflux disease)    History of inguinal hernia repair    Hx of colonic polyps    Hypertension      Past Surgical History:  Procedure Laterality Date   HERNIA REPAIR  2007   lap RIH TEP   HERNIA REPAIR  08/20/2011   lap LIH   VASECTOMY      Family  History  Problem Relation Age of Onset   Asthma Mother    COPD Father     Social History   Tobacco Use   Smoking status: Former    Types: Cigarettes   Smokeless tobacco: Never  Vaping Use   Vaping Use: Never used  Substance Use Topics   Alcohol use: Yes    Comment: 2-3 beers daily   Drug use: No    ROS   Objective:   Vitals: BP (!) 148/85 (BP Location: Right Arm)   Pulse 70   Temp 97.9 F (36.6 C) (Oral)   Resp 20   SpO2 93%   Physical Exam Constitutional:      General: He is not in acute distress.    Appearance: Normal appearance. He is well-developed and normal weight. He is not ill-appearing, toxic-appearing or diaphoretic.  HENT:     Head: Normocephalic and atraumatic.     Right Ear: Tympanic membrane, ear canal and external ear normal. There is no impacted cerumen. Tympanic membrane is not erythematous or bulging.     Left Ear: Ear canal and external ear normal. There is no impacted cerumen. Tympanic membrane is erythematous and bulging.     Ears:     Comments: Has associated clumpy white drainage over the mid inferior external  ear canal.    Nose: Nose normal.     Mouth/Throat:     Pharynx: Oropharynx is clear.  Eyes:     General: No scleral icterus.       Right eye: No discharge.        Left eye: No discharge.     Extraocular Movements: Extraocular movements intact.  Cardiovascular:     Rate and Rhythm: Normal rate.  Pulmonary:     Effort: Pulmonary effort is normal.  Musculoskeletal:     Cervical back: Normal range of motion.  Neurological:     Mental Status: He is alert and oriented to person, place, and time.  Psychiatric:        Mood and Affect: Mood normal.        Behavior: Behavior normal.        Thought Content: Thought content normal.        Judgment: Judgment normal.     Assessment and Plan :   PDMP not reviewed this encounter.  1. Other non-recurrent acute nonsuppurative otitis media of left ear   2. Other infective acute otitis  externa of left ear     Will treat for concurrent otitis media and otitis externa using amoxicillin and Cortisporin HC. Counseled patient on potential for adverse effects with medications prescribed/recommended today, ER and return-to-clinic precautions discussed, patient verbalized understanding.    Jaynee Eagles, [place] 11/11/22 1507

## 2022-11-18 ENCOUNTER — Ambulatory Visit
Admission: EM | Admit: 2022-11-18 | Discharge: 2022-11-18 | Disposition: A | Discharge status: Home / Self Care | Payer: Medicare Other | Attending: Nurse Practitioner | Admitting: Nurse Practitioner

## 2022-11-18 DIAGNOSIS — H9312 Tinnitus, left ear: Secondary | ICD-10-CM | POA: Diagnosis not present

## 2022-11-18 DIAGNOSIS — H9202 Otalgia, left ear: Secondary | ICD-10-CM

## 2022-11-18 MED ORDER — FLUTICASONE PROPIONATE 50 MCG/ACT NA SUSP: 1.0000 | Freq: Every day | NASAL | 0 refills | Status: DC

## 2022-11-18 NOTE — ED Provider Notes (Signed)
[facility]    CSN: [id number] Arrival date & time: 11/18/22  1313      History   Chief Complaint Chief Complaint  Patient presents with   Ear Pain   Cough   Nasal Congestion    HPI Justin Waters is a 72 y.o. male presents for follow-up of his ear pain.  Patient was seen in urgent care on 2/12 and diagnosed with a left otitis media and otitis externa.  He was started on amoxicillin and Cortisporin.  He reports no change in symptoms.  Continues to have tinnitus in the left ear with ear pain.  He also continues to have some sinus congestion.  No fevers.  He has not used any OTC medications.  No other concerns at this time.   Cough Associated symptoms: ear pain     Past Medical History:  Diagnosis Date   Arthritis    Bipolar 1 disorder ([place])    ?? off meds 2012   GERD (gastroesophageal reflux disease)    History of inguinal hernia repair    Hx of colonic polyps    Hypertension     Patient Active Problem List   Diagnosis Date Noted   CAD (coronary artery disease) 10/10/2020   Pre-diabetes 10/10/2020   Dyslipidemia, goal LDL below 70 10/10/2020   COPD (chronic obstructive pulmonary disease) ([place]) 10/10/2020   Hypertensive urgency 09/19/2020   Chest pain 09/17/2020   GERD (gastroesophageal reflux disease)    Hx of colonic polyps     Past Surgical History:  Procedure Laterality Date   HERNIA REPAIR  2007   lap RIH TEP   HERNIA REPAIR  08/20/2011   lap LIH   VASECTOMY         Home Medications    Prior to Admission medications   Medication Sig Start Date End Date Taking? Authorizing Provider  amoxicillin (AMOXIL) 875 MG tablet Take 1 tablet (875 mg total) by mouth 2 (two) times daily. 11/11/22  Yes Jaynee Eagles, PA-C  fluticasone (FLONASE) 50 MCG/ACT nasal spray Place 1 spray into both nostrils daily. 11/18/22  Yes Melynda Ripple, NP  aspirin EC 81 MG EC tablet Take 1 tablet (81 mg total) by mouth daily. Swallow whole. 09/21/20   Debbe Odea, MD   buPROPion (WELLBUTRIN XL) 150 MG 24 hr tablet Take 150 mg by mouth daily.    [provider]  carvedilol (COREG) 6.25 MG tablet TAKE 1 TABLET(6.25 MG) BY MOUTH TWICE DAILY 09/18/22   Elouise Munroe, MD  Cholecalciferol (VITAMIN D3 PO) Take 1 tablet by mouth daily.    [provider]  irbesartan (AVAPRO) 150 MG tablet Take 1 tablet (150 mg total) by mouth daily. 10/10/20   Erlene Quan, PA-C  metFORMIN (GLUCOPHAGE) 500 MG tablet Take 1 tablet (500 mg total) by mouth 2 (two) times daily with a meal. 09/20/20 10/12/21  Debbe Odea, MD  Multiple Vitamin (MULTIVITAMIN) capsule Take 1 capsule by mouth daily.    [provider]  neomycin-polymyxin-hydrocortisone (CORTISPORIN) 3.5-10000-1 OTIC suspension Place 4 drops into the left ear 3 (three) times daily. 11/11/22   Jaynee Eagles, PA-C  omeprazole (PRILOSEC) 40 MG capsule Take 40 mg by mouth as needed. 09/07/20   [provider]  Probiotic Product (PROBIOTIC-10 PO) Take 1 tablet by mouth daily.    [provider]  rosuvastatin (CRESTOR) 40 MG tablet TAKE 1 TABLET(40 MG) BY MOUTH DAILY 10/18/22   Lenna Sciara, NP    Family History Family  History  Problem Relation Age of Onset   Asthma Mother    COPD Father     Social History Social History   Tobacco Use   Smoking status: Former    Types: Cigarettes   Smokeless tobacco: Never  Vaping Use   Vaping Use: Never used  Substance Use Topics   Alcohol use: Yes    Comment: 2-3 beers daily   Drug use: No     Allergies   Patient has no known allergies.   Review of Systems Review of Systems  HENT:  Positive for ear pain.      Physical Exam Triage Vital Signs ED Triage Vitals  Enc Vitals Group     BP 11/18/22 1430 (!) 167/91     Pulse Rate 11/18/22 1430 61     Resp 11/18/22 1430 18     Temp 11/18/22 1430 97.6 F (36.4 C)     Temp Source 11/18/22 1430 Oral     SpO2 11/18/22 1430 96 %     Weight --      Height --      Head  Circumference --      Peak Flow --      Pain Score 11/18/22 1429 1     Pain Loc --      Pain Edu? --      Excl. in [place]? --    No data found.  Updated Vital Signs BP (!) 167/91 (BP Location: Right Arm)   Pulse 61   Temp 97.6 F (36.4 C) (Oral)   Resp 18   SpO2 96%   Visual Acuity Right Eye Distance:   Left Eye Distance:   Bilateral Distance:    Right Eye Near:   Left Eye Near:    Bilateral Near:     Physical Exam Vitals and nursing note reviewed.  Constitutional:      Appearance: Normal appearance.  HENT:     Head: Normocephalic and atraumatic.     Right Ear: Tympanic membrane and ear canal normal.     Left Ear: There is no impacted cerumen. Tympanic membrane is not injected or erythematous.     Ears:     Comments: Scant amount of mucoid drainage noted in ear canal.  TM intact and without erythema Eyes:     Pupils: Pupils are equal, round, and reactive to light.  Cardiovascular:     Rate and Rhythm: Normal rate.  Pulmonary:     Effort: Pulmonary effort is normal.  Skin:    General: Skin is warm and dry.  Neurological:     General: No focal deficit present.     Mental Status: He is alert and oriented to person, place, and time.  Psychiatric:        Mood and Affect: Mood normal.        Behavior: Behavior normal.      UC Treatments / Results  Labs (all labs ordered are listed, but only abnormal results are displayed) Labs Reviewed - No data to display  EKG   Radiology No results found.  Procedures Procedures (including critical care time)  Medications Ordered in UC Medications - No data to display  Initial Impression / Assessment and Plan / UC Course  I have reviewed the triage vital signs and the nursing notes.  Pertinent labs & imaging results that were available during my care of the patient were reviewed by me and considered in my medical decision making (see chart for details).  Advised to continue amoxicillin and Cortisporin as  prescribed Will add on Flonase Advise ENT follow-up if symptoms or not improving Follow-up with PCP in 2 to 3 days for recheck ER precautions reviewed and patient verbalized understanding Final Clinical Impressions(s) / UC Diagnoses   Final diagnoses:  Otalgia, left ear  Tinnitus of left ear     Discharge Instructions      Completed oral antibiotics and antibiotic eardrops as previously prescribed. Start Flonase daily Please follow-up with ENT if your symptoms or not improving Please go the emergency room for any worsening symptoms   ED Prescriptions     Medication Sig Dispense Auth. Provider   fluticasone (FLONASE) 50 MCG/ACT nasal spray Place 1 spray into both nostrils daily. 15.8 mL Melynda Ripple, NP      PDMP not reviewed this encounter.   Melynda Ripple, NP 11/18/22 [phone]

## 2022-11-18 NOTE — Discharge Instructions (Signed)
Completed oral antibiotics and antibiotic eardrops as previously prescribed. Start Flonase daily Please follow-up with ENT if your symptoms or not improving Please go the emergency room for any worsening symptoms

## 2022-11-18 NOTE — ED Triage Notes (Signed)
Pt c/o left ear pain that has not resolved with prescribed medication. The patient states he is having more congestion and a cough.

## 2022-11-18 NOTE — ED Notes (Signed)
Attempted to call phone (patient listed in car)

## 2022-11-29 DIAGNOSIS — J310 Chronic rhinitis: Secondary | ICD-10-CM | POA: Diagnosis not present

## 2022-11-29 DIAGNOSIS — H903 Sensorineural hearing loss, bilateral: Secondary | ICD-10-CM | POA: Diagnosis not present

## 2022-11-29 DIAGNOSIS — J343 Hypertrophy of nasal turbinates: Secondary | ICD-10-CM | POA: Diagnosis not present

## 2022-11-29 DIAGNOSIS — J342 Deviated nasal septum: Secondary | ICD-10-CM | POA: Diagnosis not present

## 2022-11-29 DIAGNOSIS — H6982 Other specified disorders of Eustachian tube, left ear: Secondary | ICD-10-CM | POA: Diagnosis not present

## 2022-12-14 ENCOUNTER — Other Ambulatory Visit: Payer: Self-pay | Admitting: Internal Medicine

## 2023-01-01 DIAGNOSIS — J310 Chronic rhinitis: Secondary | ICD-10-CM | POA: Diagnosis not present

## 2023-01-01 DIAGNOSIS — J343 Hypertrophy of nasal turbinates: Secondary | ICD-10-CM | POA: Diagnosis not present

## 2023-01-01 DIAGNOSIS — H6982 Other specified disorders of Eustachian tube, left ear: Secondary | ICD-10-CM | POA: Diagnosis not present

## 2023-01-01 DIAGNOSIS — H903 Sensorineural hearing loss, bilateral: Secondary | ICD-10-CM | POA: Diagnosis not present

## 2023-01-01 DIAGNOSIS — J342 Deviated nasal septum: Secondary | ICD-10-CM | POA: Diagnosis not present

## 2023-01-03 DIAGNOSIS — E7800 Pure hypercholesterolemia, unspecified: Secondary | ICD-10-CM | POA: Diagnosis not present

## 2023-01-03 DIAGNOSIS — R7303 Prediabetes: Secondary | ICD-10-CM | POA: Diagnosis not present

## 2023-01-09 DIAGNOSIS — F330 Major depressive disorder, recurrent, mild: Secondary | ICD-10-CM | POA: Diagnosis not present

## 2023-01-09 DIAGNOSIS — I700 Atherosclerosis of aorta: Secondary | ICD-10-CM | POA: Diagnosis not present

## 2023-01-09 DIAGNOSIS — J432 Centrilobular emphysema: Secondary | ICD-10-CM | POA: Diagnosis not present

## 2023-01-09 DIAGNOSIS — Z9989 Dependence on other enabling machines and devices: Secondary | ICD-10-CM | POA: Diagnosis not present

## 2023-01-09 DIAGNOSIS — R5383 Other fatigue: Secondary | ICD-10-CM | POA: Diagnosis not present

## 2023-01-13 ENCOUNTER — Other Ambulatory Visit: Payer: Self-pay | Admitting: Nurse Practitioner

## 2023-01-15 DIAGNOSIS — Z0000 Encounter for general adult medical examination without abnormal findings: Secondary | ICD-10-CM | POA: Diagnosis not present

## 2023-01-23 DIAGNOSIS — I10 Essential (primary) hypertension: Secondary | ICD-10-CM | POA: Diagnosis not present

## 2023-01-29 ENCOUNTER — Ambulatory Visit: Payer: Medicare Other | Admitting: Internal Medicine

## 2023-02-01 ENCOUNTER — Other Ambulatory Visit: Payer: Self-pay | Admitting: Internal Medicine

## 2023-02-04 ENCOUNTER — Other Ambulatory Visit: Payer: Self-pay

## 2023-02-04 MED ORDER — CARVEDILOL 6.25 MG PO TABS: ORAL_TABLET | ORAL | 0 refills | Status: DC

## 2023-02-07 ENCOUNTER — Encounter: Payer: Self-pay | Admitting: Internal Medicine

## 2023-02-07 ENCOUNTER — Ambulatory Visit: Payer: Medicare Other | Attending: Internal Medicine | Admitting: Internal Medicine

## 2023-02-07 VITALS — BP 126/82 | HR 75 | Ht 72.0 in | Wt 198.4 lb

## 2023-02-07 DIAGNOSIS — I700 Atherosclerosis of aorta: Secondary | ICD-10-CM

## 2023-02-07 DIAGNOSIS — I2510 Atherosclerotic heart disease of native coronary artery without angina pectoris: Secondary | ICD-10-CM

## 2023-02-07 DIAGNOSIS — I340 Nonrheumatic mitral (valve) insufficiency: Secondary | ICD-10-CM

## 2023-02-07 DIAGNOSIS — R252 Cramp and spasm: Secondary | ICD-10-CM

## 2023-02-07 DIAGNOSIS — I10 Essential (primary) hypertension: Secondary | ICD-10-CM

## 2023-02-07 DIAGNOSIS — Z79899 Other long term (current) drug therapy: Secondary | ICD-10-CM | POA: Diagnosis not present

## 2023-02-07 DIAGNOSIS — E785 Hyperlipidemia, unspecified: Secondary | ICD-10-CM

## 2023-02-07 DIAGNOSIS — I358 Other nonrheumatic aortic valve disorders: Secondary | ICD-10-CM

## 2023-02-07 MED ORDER — EZETIMIBE 10 MG PO TABS: 10.0000 mg | ORAL_TABLET | Freq: Every day | ORAL | 3 refills | Status: DC

## 2023-02-07 NOTE — Patient Instructions (Signed)
Medication Instructions:  Start Zetia 10 mg daily If Blood Pressure 100 to 110 or less take 1/2 tablet of Irbesartan Continue all other medications *If you need a refill on your cardiac medications before your next appointment, please call your pharmacy*   Lab Work: None ordered   Testing/Procedures: None  ordered   Follow-Up: At [facility], you and your health needs are our priority.  As part of our continuing mission to provide you with exceptional heart care, we have created designated Provider Care Teams.  These Care Teams include your primary Cardiologist (physician) and Advanced Practice Providers (APPs -  Physician Assistants and Nurse Practitioners) who all work together to provide you with the care you need, when you need it.  We recommend signing up for the patient portal called "MyChart".  Sign up information is provided on this After Visit Summary.  MyChart is used to connect with patients for Virtual Visits (Telemedicine).  Patients are able to view lab/test results, encounter notes, upcoming appointments, etc.  Non-urgent messages can be sent to your provider as well.   To learn more about what you can do with MyChart, go to [email or web address].    Your next appointment:  1 year    Call in Feb to schedule May appointment     Provider:  Dr.Acharya

## 2023-02-07 NOTE — Progress Notes (Signed)
Office Visit    Patient Name: Justin Waters [facility] Date of Encounter: 02/07/2023  Primary Care Provider:  Ileana Ladd, MD (Inactive) Primary Cardiologist:  Parke Poisson, MD  Chief Complaint    72 year old male with a history of moderate CAD (per coronary CTA 08/2020, calcium score 2111, managed medically), hypertension with hypertensive urgency, hyperlipidemia, prediabetes, COPD, tobacco use and GERD who presents for follow-up related to CAD and hypertension.  Past Medical History    Past Medical History:  Diagnosis Date   Arthritis    Bipolar 1 disorder ([facility])    ?? off meds 2012   GERD (gastroesophageal reflux disease)    History of inguinal hernia repair    Hx of colonic polyps    Hypertension    Past Surgical History:  Procedure Laterality Date   HERNIA REPAIR  2007   lap RIH TEP   HERNIA REPAIR  08/20/2011   lap LIH   VASECTOMY      Allergies  No Known Allergies  History of Present Illness    72 year old male with the above past medical history including moderate CAD (per coronary CTA 08/2020, calcium score 2111, managed medically), hypertension with hypertensive urgency, hyperlipidemia, prediabetes, COPD, tobacco use, and GERD.  He was found to have moderate CAD on coronary CTA during hospitalization in the setting of chest pain, hypertensive urgency in December 2021. managed medically.  Coronary CTA with FFR revealed a calcium score of 2111, 95th percentile for age and sex, as well as plaque in the LM, LAD, LCx, and RCA, though FFR hemodynamically significant only in the distal LAD, likely due to diffuse disease in the small caliber distal LAD. Echocardiogram at the time showed an EF of 65 to 70%, no R WMA, mild LVH, G1 DD, mild-moderate MR, moderate to severe mitral annular calcification, mild aortic valve sclerosis with no evidence of stenosis. He was last seen in the office on October 27, 2020 for preoperative clearance in anticipation of hernia repair  surgery and was doing well from a cardiac standpoint. He was advised to follow-up with cardiology following a routine CT chest for lung cancer screening that showed aortic atherosclerosis, left main and three-vessel CAD, as well as severe calcifications of the aortic valve and mitral annulus.  Last visit describes some hypotension and was instructed to have his carvedilol.  Because of mild hypertension afterward HCTZ was added to his irbesartan by his primary care physician.  He does still note some lower blood pressures though it feels it is less frequent and he is tolerating medical therapy well.  He does note however that at times he will feel unwell in the afternoon and will recheck his blood pressure and sometimes it is 100 systolic or less.  Occasionally his blood pressure is this low in the morning prior to taking his medications.  We have discussed taking only half a tab of irbesartan HCTZ on the mornings when his systolic blood pressure is already 100 or less.  LDL is 73 from checked a few weeks ago triglycerides optimized.  We discussed adding Zetia given moderate coronary artery disease for more aggressive preventive care.  He is in agreement.  Denies chest pain or shortness of breath, otherwise is feeling well.  Home Medications    Current Outpatient Medications  Medication Sig Dispense Refill   amoxicillin (AMOXIL) 875 MG tablet Take 1 tablet (875 mg total) by mouth 2 (two) times daily. 20 tablet 0   aspirin EC 81 MG EC tablet  Take 1 tablet (81 mg total) by mouth daily. Swallow whole. 30 tablet 11   buPROPion (WELLBUTRIN XL) 150 MG 24 hr tablet Take 150 mg by mouth daily.     carvedilol (COREG) 6.25 MG tablet TAKE 1 TABLET(6.25 MG) BY MOUTH TWICE DAILY 60 tablet 0   cyanocobalamin (VITAMIN B12) 1000 MCG tablet Take 1,000 mcg by mouth daily.     ezetimibe (ZETIA) 10 MG tablet Take 1 tablet (10 mg total) by mouth daily. 90 tablet 3   fluticasone (FLONASE) 50 MCG/ACT nasal spray Place 1  spray into both nostrils daily. 15.8 mL 0   irbesartan-hydrochlorothiazide (AVALIDE) 150-12.5 MG tablet Take 1 tablet by mouth daily.     Multiple Vitamin (MULTIVITAMIN) capsule Take 1 capsule by mouth daily.     omeprazole (PRILOSEC) 40 MG capsule Take 40 mg by mouth as needed.     Probiotic Product (PROBIOTIC-10 PO) Take 1 tablet by mouth daily.     rosuvastatin (CRESTOR) 40 MG tablet Take 1 tablet (40 mg total) by mouth daily. KEEP MAY OV. 90 tablet 0   Cholecalciferol (VITAMIN D3 PO) Take 1 tablet by mouth daily. (Patient not taking: Reported on 02/07/2023)     irbesartan (AVAPRO) 150 MG tablet Take 1 tablet (150 mg total) by mouth daily. 90 tablet 3   metFORMIN (GLUCOPHAGE) 500 MG tablet Take 1 tablet (500 mg total) by mouth 2 (two) times daily with a meal. 60 tablet 11   neomycin-polymyxin-hydrocortisone (CORTISPORIN) 3.5-10000-1 OTIC suspension Place 4 drops into the left ear 3 (three) times daily. 10 mL 0   No current facility-administered medications for this visit.     Review of Systems    All other systems reviewed and are otherwise negative except as noted above.   Physical Exam    VS:  BP 126/82   Pulse 75   Ht 6' (1.829 m)   Wt 198 lb 6.4 oz (90 kg)   SpO2 95%   BMI 26.91 kg/m  Constitutional: No acute distress Eyes: sclera non-icteric, normal conjunctiva and lids ENMT: normal dentition, moist mucous membranes Cardiovascular: regular rhythm, normal rate, 1/6 mid peaking systolic murmur. S1 and S2 normal. No jugular venous distention.  Respiratory: clear to auscultation bilaterally GI : normal bowel sounds, soft and nontender. No distention.   MSK: extremities warm, well perfused. No edema.  NEURO: grossly nonfocal exam, moves all extremities. PSYCH: alert and oriented x 3, normal mood and affect.    Accessory Clinical Findings    ECG personally reviewed by me today - NSR  Lab Results  Component Value Date   WBC 7.1 10/21/2022   HGB 14.1 10/21/2022   HCT 43.1  10/21/2022   MCV 88.5 10/21/2022   PLT 296 10/21/2022   Lab Results  Component Value Date   CREATININE 1.09 10/21/2022   BUN 23 10/21/2022   NA 136 10/21/2022   K 4.1 10/21/2022   CL 102 10/21/2022   CO2 22 10/21/2022   Lab Results  Component Value Date   ALT 19 10/21/2022   AST 18 10/21/2022   ALKPHOS 62 10/21/2022   BILITOT 0.4 10/21/2022   Lab Results  Component Value Date   CHOL 270 (H) 09/19/2020   HDL 33 (L) 09/19/2020   LDLCALC 201 (H) 09/19/2020   TRIG 179 (H) 09/19/2020   CHOLHDL 8.2 09/19/2020    Lab Results  Component Value Date   HGBA1C 6.4 (H) 09/19/2020    Assessment & Plan    1. Dyslipidemia, goal LDL  below 70   2. Coronary artery disease involving native coronary artery of native heart without angina pectoris   3. Essential hypertension   4. Medication management   5. Nonrheumatic aortic valve sclerosis   6. Aortic atherosclerosis (HCC)   7. Nonrheumatic mitral valve regurgitation   8. Hyperlipidemia LDL goal <70   9. Leg cramps      1. Moderate CAD/Aortic atherosclerosis/AV sclerosis/Mild-moderate MR: Recent routine CT chest for lung cancer screening that showed aortic atherosclerosis, left main and three-vessel CAD, as well as severe calcifications of the aortic valve and mitral annulus.  These findings are consistent with prior coronary CTA with FFR in 2021, which revealed a calcium score of 2111, 95th percentile for age and sex, as well as plaque in the LM, LAD, LCx, and RCA, though FFR hemodynamically significant only in the distal LAD, likely due to diffuse disease in the small caliber distal LAD.  This has been managed medically. Echocardiogram in 2021 showed an EF of 65 to 70%, no R WMA, mild LVH, G1 DD, mild-moderate MR, moderate to severe mitral annular calcification, mild aortic valve sclerosis with no evidence of stenosis.  -Mild systolic murmur representative of aortic valve sclerosis on last echocardiogram, recheck echo in 1 to 2  years -Continue rosuvastatin 40 mg daily, add Zetia 10 mg daily for further GDMT for secondary prevention of CAD.  2. Hypertension/possible orthostatic hypotension: Occasional mild hypotension.  He will take half a dose of irbesartan HCTZ on the mornings when his blood pressure is already systolic blood pressure less than 110.  Continue other therapy as scheduled.  3. Hyperlipidemia: LDL 73 on 01/18/2023. Managed and monitored by PCP.  Continue aspirin, rosuvastatin.  Add Zetia as above  4. Leg cramps: He takes yellow mustard when the cramps are very severe which helps.  Instructed that he may take magnesium supplementation as needed, emphasize good hydration.  Total time of encounter: 30 minutes total time of encounter, including 20 minutes spent in face-to-face patient care on the date of this encounter. This time includes coordination of care and counseling regarding above mentioned problem list. Remainder of non-face-to-face time involved reviewing chart documents/testing relevant to the patient encounter and documentation in the medical record. I have independently reviewed documentation from referring provider.   Weston Brass, MD, [facility]  [facility]   Meds ordered this encounter  Medications   ezetimibe (ZETIA) 10 MG tablet    Sig: Take 1 tablet (10 mg total) by mouth daily.    Dispense:  90 tablet    Refill:  3   Orders Placed This Encounter  Procedures   EKG 12-Lead    Patient Instructions  Medication Instructions:  Start Zetia 10 mg daily If Blood Pressure 100 to 110 or less take 1/2 tablet of Irbesartan Continue all other medications *If you need a refill on your cardiac medications before your next appointment, please call your pharmacy*   Lab Work: None ordered   Testing/Procedures: None  ordered   Follow-Up: At [facility], you and your health needs are our priority.  As part of our continuing mission to provide you with exceptional  heart care, we have created designated Provider Care Teams.  These Care Teams include your primary Cardiologist (physician) and Advanced Practice Providers (APPs -  Physician Assistants and Nurse Practitioners) who all work together to provide you with the care you need, when you need it.  We recommend signing up for the patient portal called "MyChart".  Sign  up information is provided on this After Visit Summary.  MyChart is used to connect with patients for Virtual Visits (Telemedicine).  Patients are able to view lab/test results, encounter notes, upcoming appointments, etc.  Non-urgent messages can be sent to your provider as well.   To learn more about what you can do with MyChart, go to [email or web address].    Your next appointment:  1 year    Call in Feb to schedule May appointment     Provider:  Dr.Xavious Sharrar

## 2023-02-11 DIAGNOSIS — K2100 Gastro-esophageal reflux disease with esophagitis, without bleeding: Secondary | ICD-10-CM | POA: Diagnosis not present

## 2023-02-11 DIAGNOSIS — I10 Essential (primary) hypertension: Secondary | ICD-10-CM | POA: Diagnosis not present

## 2023-02-11 DIAGNOSIS — G4733 Obstructive sleep apnea (adult) (pediatric): Secondary | ICD-10-CM | POA: Diagnosis not present

## 2023-02-18 DIAGNOSIS — K080 Exfoliation of teeth due to systemic causes: Secondary | ICD-10-CM | POA: Diagnosis not present

## 2023-03-06 ENCOUNTER — Other Ambulatory Visit: Payer: Self-pay | Admitting: Nurse Practitioner

## 2023-03-06 DIAGNOSIS — E119 Type 2 diabetes mellitus without complications: Secondary | ICD-10-CM | POA: Diagnosis not present

## 2023-03-14 DIAGNOSIS — G4733 Obstructive sleep apnea (adult) (pediatric): Secondary | ICD-10-CM | POA: Diagnosis not present

## 2023-04-13 DIAGNOSIS — G4733 Obstructive sleep apnea (adult) (pediatric): Secondary | ICD-10-CM | POA: Diagnosis not present

## 2023-05-06 DIAGNOSIS — K080 Exfoliation of teeth due to systemic causes: Secondary | ICD-10-CM | POA: Diagnosis not present

## 2023-05-09 ENCOUNTER — Other Ambulatory Visit: Payer: Self-pay | Admitting: Nurse Practitioner

## 2023-05-22 ENCOUNTER — Ambulatory Visit (INDEPENDENT_AMBULATORY_CARE_PROVIDER_SITE_OTHER): Payer: Medicare Other

## 2023-05-22 ENCOUNTER — Encounter: Payer: Self-pay | Admitting: Podiatry

## 2023-05-22 ENCOUNTER — Ambulatory Visit (INDEPENDENT_AMBULATORY_CARE_PROVIDER_SITE_OTHER): Payer: Medicare Other | Admitting: Podiatry

## 2023-05-22 DIAGNOSIS — E559 Vitamin D deficiency, unspecified: Secondary | ICD-10-CM

## 2023-05-22 DIAGNOSIS — M778 Other enthesopathies, not elsewhere classified: Secondary | ICD-10-CM | POA: Diagnosis not present

## 2023-05-22 DIAGNOSIS — M19071 Primary osteoarthritis, right ankle and foot: Secondary | ICD-10-CM | POA: Diagnosis not present

## 2023-05-22 DIAGNOSIS — M19072 Primary osteoarthritis, left ankle and foot: Secondary | ICD-10-CM | POA: Diagnosis not present

## 2023-05-22 NOTE — Progress Notes (Signed)
  Subjective:  Patient ID: Justin Waters, male    DOB: 1951/08/02,  MRN: [id number]  Chief Complaint  Patient presents with   Arthritis    RM3: athritis in bilat foot    72 y.o. male presents with the above complaint. History confirmed with patient.  Has been gradually worsening over time, his left foot is the most painful.  Objective:  Physical Exam: warm, good capillary refill, no trophic changes or ulcerative lesions, normal DP and PT pulses, normal sensory exam, and pain swelling tenderness and limited range of motion of the left first MTPJ.   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 Vitamin D deficiency      Plan:  Patient was evaluated and treated and all questions answered.   Reviewed his radiographs and clinical exam findings.  We discussed the presence of the end-stage hallux rigidus and arthrosis of the first MTPJ.  We discussed treatment options including orthotic support, appropriate shoe gear and corticosteroid injection for pain relief.  He feels like he is well beyond this and anti-inflammatories now do not even control his discomfort.  Having difficulty doing daily activities and finding appropriate shoe gear.  We discussed surgical treatment options.  We discussed the risk benefits and potential complications of surgical intervention including but not limited to  pain, swelling, infection, scar, numbness which may be temporary or permanent, chronic pain, stiffness, nerve pain or damage, wound healing problems, bone healing problems including delayed or non-union.  He understands and wishes to proceed.  Informed consent signed and reviewed.  He met with our surgery scheduler and outpatient surgery will be scheduled.  We will check his vitamin D level, his A1c he thinks is  well-controlled, he will let me know what it is from his PCP   Surgical plan:  Procedure: -Left first metatarsal phalangeal joint arthrodesis  Location: -[facility]  Anesthesia plan: -IV sedation and block  Postoperative pain plan: - Tylenol 1000 mg every 6 hours, ibuprofen 600 mg every 6 hours, gabapentin 300 mg every 8 hours x5 days, oxycodone 5 mg 1-2 tabs every 6 hours only as needed  DVT prophylaxis: -None required  WB Restrictions / DME needs: -NWB in CAM boot postop with knee scooter    No follow-ups on file.

## 2023-06-09 ENCOUNTER — Ambulatory Visit
Admission: RE | Admit: 2023-06-09 | Discharge: 2023-06-09 | Disposition: A | Discharge status: Home / Self Care | Payer: Medicare Other | Source: Ambulatory Visit | Attending: Acute Care | Admitting: Acute Care

## 2023-06-09 DIAGNOSIS — Z122 Encounter for screening for malignant neoplasm of respiratory organs: Secondary | ICD-10-CM

## 2023-06-09 DIAGNOSIS — Z87891 Personal history of nicotine dependence: Secondary | ICD-10-CM

## 2023-06-11 ENCOUNTER — Telehealth: Payer: Self-pay | Admitting: Podiatry

## 2023-06-11 ENCOUNTER — Encounter: Payer: Self-pay | Admitting: Podiatry

## 2023-06-11 NOTE — Telephone Encounter (Signed)
Patient has questions regarding surgery. He is wondering what other options he may have in regards to the type of surgery. He also has questions regarding walking and other types of motion once the surgery is completed; and what possible repercussions he could face without surgical intervention.

## 2023-06-11 NOTE — Telephone Encounter (Signed)
Returned his call he did not answer, left voicemail that I would send MyChart message

## 2023-06-23 ENCOUNTER — Other Ambulatory Visit: Payer: Self-pay | Admitting: Acute Care

## 2023-06-23 ENCOUNTER — Telehealth: Payer: Self-pay | Admitting: Podiatry

## 2023-06-23 DIAGNOSIS — Z122 Encounter for screening for malignant neoplasm of respiratory organs: Secondary | ICD-10-CM

## 2023-06-23 DIAGNOSIS — Z87891 Personal history of nicotine dependence: Secondary | ICD-10-CM

## 2023-06-23 NOTE — Telephone Encounter (Signed)
Patient called to cancel surgery. Reason: "Having second thoughts about it." Advised [facility] and Dr Lilian Kapur

## 2023-06-26 DIAGNOSIS — G4733 Obstructive sleep apnea (adult) (pediatric): Secondary | ICD-10-CM | POA: Diagnosis not present

## 2023-07-10 ENCOUNTER — Encounter: Payer: Medicare Other | Admitting: Podiatry

## 2023-07-24 ENCOUNTER — Encounter: Payer: Medicare Other | Admitting: Podiatry

## 2023-07-24 DIAGNOSIS — F330 Major depressive disorder, recurrent, mild: Secondary | ICD-10-CM | POA: Diagnosis not present

## 2023-07-24 DIAGNOSIS — J432 Centrilobular emphysema: Secondary | ICD-10-CM | POA: Diagnosis not present

## 2023-07-24 DIAGNOSIS — E038 Other specified hypothyroidism: Secondary | ICD-10-CM | POA: Diagnosis not present

## 2023-07-24 DIAGNOSIS — R7303 Prediabetes: Secondary | ICD-10-CM | POA: Diagnosis not present

## 2023-07-24 DIAGNOSIS — E7800 Pure hypercholesterolemia, unspecified: Secondary | ICD-10-CM | POA: Diagnosis not present

## 2023-07-24 DIAGNOSIS — R7309 Other abnormal glucose: Secondary | ICD-10-CM | POA: Diagnosis not present

## 2023-07-24 DIAGNOSIS — R946 Abnormal results of thyroid function studies: Secondary | ICD-10-CM | POA: Diagnosis not present

## 2023-07-24 DIAGNOSIS — Z23 Encounter for immunization: Secondary | ICD-10-CM | POA: Diagnosis not present

## 2023-07-24 DIAGNOSIS — I10 Essential (primary) hypertension: Secondary | ICD-10-CM | POA: Diagnosis not present

## 2023-07-24 DIAGNOSIS — Z125 Encounter for screening for malignant neoplasm of prostate: Secondary | ICD-10-CM | POA: Diagnosis not present

## 2023-07-26 DIAGNOSIS — G4733 Obstructive sleep apnea (adult) (pediatric): Secondary | ICD-10-CM | POA: Diagnosis not present

## 2023-08-14 ENCOUNTER — Encounter: Payer: Medicare Other | Admitting: Podiatry

## 2023-08-26 DIAGNOSIS — G4733 Obstructive sleep apnea (adult) (pediatric): Secondary | ICD-10-CM | POA: Diagnosis not present

## 2023-09-01 DIAGNOSIS — K080 Exfoliation of teeth due to systemic causes: Secondary | ICD-10-CM | POA: Diagnosis not present

## 2023-09-20 ENCOUNTER — Emergency Department (HOSPITAL_COMMUNITY): Payer: Medicare Other

## 2023-09-20 ENCOUNTER — Encounter (HOSPITAL_COMMUNITY): Payer: Self-pay

## 2023-09-20 ENCOUNTER — Emergency Department (HOSPITAL_COMMUNITY)
Admission: EM | Admit: 2023-09-20 | Discharge: 2023-09-20 | Disposition: A | Discharge status: Home / Self Care | Payer: Medicare Other | Attending: Emergency Medicine | Admitting: Emergency Medicine

## 2023-09-20 ENCOUNTER — Other Ambulatory Visit: Payer: Self-pay

## 2023-09-20 DIAGNOSIS — R0789 Other chest pain: Secondary | ICD-10-CM | POA: Insufficient documentation

## 2023-09-20 DIAGNOSIS — I10 Essential (primary) hypertension: Secondary | ICD-10-CM | POA: Insufficient documentation

## 2023-09-20 DIAGNOSIS — Z7951 Long term (current) use of inhaled steroids: Secondary | ICD-10-CM | POA: Insufficient documentation

## 2023-09-20 DIAGNOSIS — Z7982 Long term (current) use of aspirin: Secondary | ICD-10-CM | POA: Diagnosis not present

## 2023-09-20 DIAGNOSIS — R079 Chest pain, unspecified: Secondary | ICD-10-CM | POA: Diagnosis not present

## 2023-09-20 DIAGNOSIS — E119 Type 2 diabetes mellitus without complications: Secondary | ICD-10-CM | POA: Diagnosis not present

## 2023-09-20 DIAGNOSIS — Z7984 Long term (current) use of oral hypoglycemic drugs: Secondary | ICD-10-CM | POA: Diagnosis not present

## 2023-09-20 DIAGNOSIS — R911 Solitary pulmonary nodule: Secondary | ICD-10-CM | POA: Diagnosis not present

## 2023-09-20 DIAGNOSIS — K4090 Unilateral inguinal hernia, without obstruction or gangrene, not specified as recurrent: Secondary | ICD-10-CM | POA: Diagnosis not present

## 2023-09-20 DIAGNOSIS — J449 Chronic obstructive pulmonary disease, unspecified: Secondary | ICD-10-CM | POA: Diagnosis not present

## 2023-09-20 DIAGNOSIS — Z79899 Other long term (current) drug therapy: Secondary | ICD-10-CM | POA: Insufficient documentation

## 2023-09-20 DIAGNOSIS — N400 Enlarged prostate without lower urinary tract symptoms: Secondary | ICD-10-CM | POA: Diagnosis not present

## 2023-09-20 DIAGNOSIS — I701 Atherosclerosis of renal artery: Secondary | ICD-10-CM | POA: Diagnosis not present

## 2023-09-20 DIAGNOSIS — J439 Emphysema, unspecified: Secondary | ICD-10-CM | POA: Diagnosis not present

## 2023-09-20 DIAGNOSIS — Z87891 Personal history of nicotine dependence: Secondary | ICD-10-CM | POA: Insufficient documentation

## 2023-09-20 DIAGNOSIS — R918 Other nonspecific abnormal finding of lung field: Secondary | ICD-10-CM | POA: Diagnosis not present

## 2023-09-20 LAB — BASIC METABOLIC PANEL
Anion gap: 11 (ref 5–15)
BUN: 15 mg/dL (ref 8–23)
CO2: 25 mmol/L (ref 22–32)
Calcium: 10.1 mg/dL (ref 8.9–10.3)
Chloride: 105 mmol/L (ref 98–111)
Creatinine, Ser: 1.13 mg/dL (ref 0.61–1.24)
GFR, Estimated: >60 mL/min (ref >60)
Glucose, Bld: 108 mg/dL — ABNORMAL HIGH (ref 70–99)
Potassium: 4.5 mmol/L (ref 3.5–5.1)
Sodium: 141 mmol/L (ref 135–145)

## 2023-09-20 LAB — TROPONIN I (HIGH SENSITIVITY)
Troponin I (High Sensitivity): 10 ng/L (ref <18)
Troponin I (High Sensitivity): 9 ng/L (ref <18)

## 2023-09-20 LAB — CBC
HCT: 41.8 % (ref 39.0–52.0)
Hemoglobin: 13.4 g/dL (ref 13.0–17.0)
MCH: 28.5 pg (ref 26.0–34.0)
MCHC: 32.1 g/dL (ref 30.0–36.0)
MCV: 88.9 fL (ref 80.0–100.0)
Platelets: 306 10*3/uL (ref 150–400)
RBC: 4.7 MIL/uL (ref 4.22–5.81)
RDW: 12 % (ref 11.5–15.5)
WBC: 9 10*3/uL (ref 4.0–10.5)
nRBC: 0 % (ref 0.0–0.2)

## 2023-09-20 MED ORDER — IOHEXOL 350 MG/ML SOLN
100.0000 mL | Freq: Once | INTRAVENOUS | Status: AC | PRN
  Administered 2023-09-20: 100 mL via INTRAVENOUS

## 2023-09-20 NOTE — ED Triage Notes (Signed)
Pt states that yesterday he began having L sided non-radiating CP. Denies SOB, N/V, diaphoresis. Reports that yesterday he checked his BP and it was 190's/100's. No hx of HTN per pt. Endorses gradual onset headache. No vision changes.

## 2023-09-20 NOTE — ED Provider Notes (Signed)
[place] EMERGENCY DEPARTMENT AT [facility] Provider Note   CSN: [id number] Arrival date & time: 09/20/23  1244     History Chief Complaint  Patient presents with   Chest Pain    Justin Waters is a 72 y.o. male with h/o COPD, DM, HTN presents to the ER for evaluation of elevated blood pressure and left-sided chest pain.  Patient reports that yesterday while at rest he started developing some left-sided chest pain/pressure.  Reports it is worse with inspiration.  Does not really feel it at rest.  Not exertional.  He thought that it may have radiated to his back but was unsure if the origin was from his chest or his back.  He denies any shortness of breath.  Denies any recent cough or cold symptoms.  He denies any abdominal pain, nausea, or vomiting.  Reports some occasional lightheadedness but no syncopal episodes.  No room spinning sensation.  Reports a mild frontal headache with gradual onset and some occasional blurry vision but none currently.  He reports compliancy to his medications.  No recent medication changes.  He denies any trouble walking or trouble talking.  He reports that he took his blood pressure at home with no systolically in the 200 range which made him very concerned.  He denies any allergies to the medications.  Former smoker.  Daily alcohol consumption with 2 beers, denies any drug use.   Chest Pain      Home Medications Prior to Admission medications   Medication Sig Start Date End Date Taking? Authorizing Provider  amoxicillin (AMOXIL) 875 MG tablet Take 1 tablet (875 mg total) by mouth 2 (two) times daily. 11/11/22   Wallis Bamberg, PA-C  aspirin EC 81 MG EC tablet Take 1 tablet (81 mg total) by mouth daily. Swallow whole. 09/21/20   Calvert Cantor, MD  buPROPion (WELLBUTRIN XL) 150 MG 24 hr tablet Take 150 mg by mouth daily.    [provider]  carvedilol (COREG) 6.25 MG tablet TAKE 1 TABLET(6.25 MG) BY MOUTH TWICE DAILY 03/06/23   Parke Poisson, MD  Cholecalciferol (VITAMIN D3 PO) Take 1 tablet by mouth daily.    [provider]  cyanocobalamin (VITAMIN B12) 1000 MCG tablet Take 1,000 mcg by mouth daily.    [provider]  ezetimibe (ZETIA) 10 MG tablet Take 1 tablet (10 mg total) by mouth daily. 02/07/23 05/08/23  Parke Poisson, MD  fluticasone (FLONASE) 50 MCG/ACT nasal spray Place 1 spray into both nostrils daily. 11/18/22   Radford Pax, NP  irbesartan (AVAPRO) 150 MG tablet Take 1 tablet (150 mg total) by mouth daily. 10/10/20   Abelino Derrick, PA-C  irbesartan-hydrochlorothiazide (AVALIDE) 150-12.5 MG tablet Take 1 tablet by mouth daily.    [provider]  metFORMIN (GLUCOPHAGE) 500 MG tablet Take 1 tablet (500 mg total) by mouth 2 (two) times daily with a meal. 09/20/20 10/12/21  Calvert Cantor, MD  Multiple Vitamin (MULTIVITAMIN) capsule Take 1 capsule by mouth daily.    [provider]  neomycin-polymyxin-hydrocortisone (CORTISPORIN) 3.5-10000-1 OTIC suspension Place 4 drops into the left ear 3 (three) times daily. 11/11/22   Wallis Bamberg, PA-C  omeprazole (PRILOSEC) 40 MG capsule Take 40 mg by mouth as needed. 09/07/20   [provider]  Probiotic Product (PROBIOTIC-10 PO) Take 1 tablet by mouth daily.    [provider]  rosuvastatin (CRESTOR) 40 MG tablet TAKE 1 TABLET BY MOUTH DAILY 05/09/23   Bernadene Person  C, NP      Currently only taking the irbesartan instead of the combo hydrochlorothiazide pill. All other medications on this list are up to date. No other recent changes.   Allergies    Patient has no known allergies.    Review of Systems   Review of Systems  Cardiovascular:  Positive for chest pain.    Physical Exam Updated Vital Signs BP (!) 171/85   Pulse 71   Temp 98 F (36.7 C)   Resp 17   Ht 6' (1.829 m)   Wt 93 kg   SpO2 99%   BMI 27.80 kg/m  Physical Exam Vitals and nursing note reviewed.  Constitutional:      General: He is not in  acute distress.    Appearance: He is not toxic-appearing or diaphoretic.  Cardiovascular:     Rate and Rhythm: Normal rate.     Pulses:          Radial pulses are 2+ on the right side and 2+ on the left side.       Dorsalis pedis pulses are 2+ on the right side and 2+ on the left side.       Posterior tibial pulses are 2+ on the right side and 2+ on the left side.  Pulmonary:     Effort: Pulmonary effort is normal. No tachypnea or respiratory distress.     Breath sounds: Normal breath sounds. No decreased breath sounds or rhonchi.  Chest:     Chest wall: No tenderness.     Comments: No overlying skin changes seen. None tender to palpation.  Abdominal:     Palpations: Abdomen is soft.     Tenderness: There is no abdominal tenderness. There is no guarding or rebound.  Musculoskeletal:     Right lower leg: No tenderness. No edema.     Left lower leg: No tenderness. No edema.  Skin:    General: Skin is warm and dry.  Neurological:     General: No focal deficit present.     Mental Status: He is alert.     Gait: Gait normal.    ED Results / Procedures / Treatments   Labs (all labs ordered are listed, but only abnormal results are displayed) Labs Reviewed  BASIC METABOLIC PANEL - Abnormal; Notable for the following components:      Result Value   Glucose, Bld 108 (*)    All other components within normal limits  CBC  TROPONIN I (HIGH SENSITIVITY)  TROPONIN I (HIGH SENSITIVITY)    EKG None  Radiology DG Chest 2 View Result Date: 09/20/2023 CLINICAL DATA:  Chest pain EXAM: CHEST - 2 VIEW COMPARISON:  09/17/2020 FINDINGS: The lungs are clear without focal pneumonia, edema, pneumothorax or pleural effusion. Interstitial markings are diffusely coarsened with chronic features. Bullous changes in noted in the upper lobes bilaterally. Vague nodular density seen left upper lobe superimposed on the posterior left fifth rib. The cardiopericardial silhouette is within normal limits for  size. Bones are diffusely demineralized. IMPRESSION: 1. 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 2. Vague nodular density left upper lobe superimposed on the posterior left fifth rib. Patient had lung cancer screening chest CT 06/09/2023 without suspicious pulmonary lesion in this region. Focus of infectious/inflammatory airspace disease cannot be excluded. Follow-up warranted. CT chest without contrast could be used to further evaluate as clinically warranted. Electronically Signed   By: Kennith Center M.D.   On: 09/20/2023 13:29  Procedures Procedures   Medications Ordered in ED Medications  iohexol (OMNIPAQUE) 350 MG/ML injection 100 mL (has no administration in time range)    ED Course/ Medical Decision Making/ A&P Clinical Course as of 09/20/23 1533  Sat Sep 20, 2023  1503 Stable 49M htn dm copd pic of media tab. Left sided chest pain on and of since yestrday worse with inspiration. Happened again today, then only when he takes a deep breath in. Maybe radiating to back, unsure. Looks great. 166 systolic recently. Admitted in past for hypertensive urgency, follows with cardiology. Took bp meds this AM. Systolic in the 200s. No abd pain. No cp at rest.   R/o dissection [JL]  1533 Nodule on cxr [JL]    Clinical Course User Index [JL] Gunnar Bulla, MD   Medical Decision Making  72 y.o. male presents to the ER for evaluation of chest pain. Differential diagnosis includes but is not limited to ACS, pericarditis, myocarditis, aortic dissection, PE, pneumothorax, esophageal spasm or rupture, chronic angina, pneumonia, bronchitis, GERD, reflux/PUD, biliary disease, pancreatitis, costochondritis, anxiety. Vital signs elevated BP initially systolic in the 200s now 171/85.  Otherwise unremarkable. Physical exam as noted above.   Workup initiated in triage.  I independently reviewed and interpreted the patient's labs.  BMP shows mild elevated glucose  at 108.  Otherwise no electrolyte abnormality.  Troponin at 10.  CBC without leukocytosis or anemia.  Chest x-ray shows 1. 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 2. Vague nodular density left upper lobe superimposed on the posterior left fifth rib. Patient had lung cancer screening chest CT 06/09/2023 without suspicious pulmonary lesion in this region. Focus of infectious/inflammatory airspace disease cannot be excluded. Follow-up warranted. CT chest without contrast could be used to further evaluate as clinically warranted. Per radiologist's interpretation.    Patient's blood pressure is already improved from systolically 200 that was 166 over 90s while I was in the room.  He does not appear in any acute distress.  Is not diaphoretic.  He is palpable DP and PT, and radial pulses that are symmetric bilaterally. Chest pain is only with deep inspiration, not worse with exertion. Denies any abdominal pain. Will handoff to oncoming shift to follow-up on CT imaging and further monitor blood pressures.  CT scan is pending.  3:17 PM Care of Alexan Mignogna Veiga  transferred to Dr. Gunnar Bulla (resident) and Dr. Eber Hong at the end of my shift as the patient will require reassessment once labs/imaging have resulted. Patient presentation, ED course, and plan of care discussed with review of all pertinent labs and imaging. Please see his/her note for further details regarding further ED course and disposition. Plan at time of handoff is follow up on dissection studies, monitor BP. This may be altered or completely changed at the discretion of the oncoming team pending results of further workup.  Portions of this report may have been transcribed using voice recognition software. Every effort was made to ensure accuracy; however, inadvertent computerized transcription errors may be present.   Final Clinical Impression(s) / ED Diagnoses Final diagnoses:  None    Rx /  DC Orders ED Discharge Orders     None         Achille Rich, PA-C 09/20/23 1610    Gloris Manchester, MD 09/20/23 [phone]

## 2023-09-20 NOTE — ED Provider Triage Note (Signed)
Emergency Medicine Provider Triage Evaluation Note  Justin Waters , a 72 y.o. male  was evaluated in triage.  Pt complains of chest pain. Endorsed noticing L upper chest pain radiates to back since yesterday afternoon when he noticed his BP elevated.  Endorse dizzy and lighthead with mild sob, pain worse with deep breath.  No abd pain, no numbness/weakness.  No hx of AAA.  Review of Systems  Positive: As above Negative: As above  Physical Exam  BP (!) 203/101 (BP Location: Left Arm)   Pulse 76   Temp 98 F (36.7 C)   Resp 16   SpO2 96%  Gen:   Awake, no distress   Resp:  Normal effort  MSK:   Moves extremities without difficulty  Other:  Intact pulses to all 4 extremities.  No palpable pulsatile abdominal mass  Medical Decision Making  Medically screening exam initiated at 1:03 PM.  Appropriate orders placed.  Justin Waters was informed that the remainder of the evaluation will be completed by another provider, this initial triage assessment does not replace that evaluation, and the importance of remaining in the ED until their evaluation is complete.  Chest pain to back with elevated BP.  Dissection study ordered.  Recommend next available room. Nurse  aware.    Justin Helper, PA-C 09/20/23 1306

## 2023-09-20 NOTE — ED Provider Notes (Signed)
Patient care assumed from previous provider.   Patient care of Justin Waters is a 72 y.o. male from previous provider. Please see the original provider note from this emergency department encounter for full history and physical.   Course of Care and my assessment at the time of sign out is detailed in the ED Course below.   Clinical Course as of 09/20/23 1658  Sat Sep 20, 2023  1503 Stable 62M htn dm copd pic of media tab. Left sided chest pain on and of since yestrday worse with inspiration. Happened again today, then only when he takes a deep breath in. Maybe radiating to back, unsure. Looks great. 166 systolic recently. Admitted in past for hypertensive urgency, follows with cardiology. Took bp meds this AM. Systolic in the 200s. No abd pain. No cp at rest.   R/o dissection [JL]  1533 Nodule on cxr [JL]    Clinical Course User Index [JL] Gunnar Bulla, MD     Labs reviewed by myself and considered in medical decision making.  Imaging reviewed by myself and considered in medical decision making. Imaging final read interpreted by radiology.  1. Chest pain, unspecified type        Briefly this is a 72 year old male patient has hypertension diabetes COPD, came with left-sided chest pain, on and off, worse with inspiration, at some point may be radiated to the back, came in hypertensive, there was concern for aortic dissection ultimately the aortic dissection scan was negative. He has had now 2 negative troponins which I followed up on the second 1 and the results of the dissection scan, a normal metabolic and hematologic panel, his chest x-ray was clear no focal pneumonia and his ECG showed no signs of ischemia or arrhythmia.  After discussing with the patient, his blood pressures are now in the mid to high 150s as he is resting, he endorses significant anxiety when he came in today.  There was a pulmonary nodule which I counseled the patient on, he will follow-up with this.  I do think it is  possible that his antihypertensive medications need to be optimized in the outpatient setting, and he will follow-up with his cardiologist and discuss this with them on Monday or Tuesday.  He will come back if any acutely changes.  Of note he has had no chest pain during this hospital stay. If his chest pain comes back, shared decision making, will come back for chest pain observation and further workup.    The plan for this patient was discussed with Dr. Eber Hong, MD , who voiced agreement and who oversaw evaluation and treatment of this patient.    Gunnar Bulla, MD 09/20/23 1658    Eber Hong, MD 09/21/23 1058

## 2023-09-20 NOTE — ED Notes (Signed)
Patient transported to CT 

## 2023-09-20 NOTE — ED Notes (Signed)
Pt ambulated to restroom. 

## 2023-09-20 NOTE — ED Notes (Signed)
Provided pt warm blanket 

## 2023-09-20 NOTE — Discharge Instructions (Addendum)
Lung nodule needing to be followed up with PCP.    You have been seen here in the emergency department for chest pain. We have obtained a full history, performed a physical exam, in addition to other diagnostic tests and treatments. Right now, we feel that you are safe for discharge from a medical perspective, and do not have an acute life threatening illness.   To do: 1.) Take all medications as prescribed.   2.) If anything changes, or you develop fevers, chills, inability to eat or drink, severe pain, new symptoms, return of symptoms, worsening of symptoms, or any other concerns, please call 911 or come back to the emergency department as soon as possible.   3.) Please make an appointment with your primary care doctor for a follow-up visit after being seen here in the emergency department. If you do not have a primary care doctor, you can call [phone] for assistance in finding one or your health care insurance company.    Thank you for allowing me to take care of you today. We hope that you feel better soon.

## 2023-10-13 ENCOUNTER — Other Ambulatory Visit: Payer: Self-pay | Admitting: Internal Medicine

## 2023-10-16 DIAGNOSIS — F331 Major depressive disorder, recurrent, moderate: Secondary | ICD-10-CM | POA: Diagnosis not present

## 2023-10-16 DIAGNOSIS — J310 Chronic rhinitis: Secondary | ICD-10-CM | POA: Diagnosis not present

## 2023-10-16 DIAGNOSIS — I2510 Atherosclerotic heart disease of native coronary artery without angina pectoris: Secondary | ICD-10-CM | POA: Diagnosis not present

## 2023-10-16 DIAGNOSIS — I10 Essential (primary) hypertension: Secondary | ICD-10-CM | POA: Diagnosis not present

## 2023-11-20 DIAGNOSIS — I10 Essential (primary) hypertension: Secondary | ICD-10-CM | POA: Diagnosis not present

## 2023-11-20 DIAGNOSIS — Z6827 Body mass index (BMI) 27.0-27.9, adult: Secondary | ICD-10-CM | POA: Diagnosis not present

## 2023-11-20 DIAGNOSIS — F331 Major depressive disorder, recurrent, moderate: Secondary | ICD-10-CM | POA: Diagnosis not present

## 2023-12-26 DIAGNOSIS — G4733 Obstructive sleep apnea (adult) (pediatric): Secondary | ICD-10-CM | POA: Diagnosis not present

## 2024-01-08 ENCOUNTER — Other Ambulatory Visit: Payer: Self-pay | Admitting: Nurse Practitioner

## 2024-01-21 DIAGNOSIS — R7303 Prediabetes: Secondary | ICD-10-CM | POA: Diagnosis not present

## 2024-01-21 DIAGNOSIS — E7800 Pure hypercholesterolemia, unspecified: Secondary | ICD-10-CM | POA: Diagnosis not present

## 2024-01-21 DIAGNOSIS — R0609 Other forms of dyspnea: Secondary | ICD-10-CM | POA: Diagnosis not present

## 2024-01-21 DIAGNOSIS — I10 Essential (primary) hypertension: Secondary | ICD-10-CM | POA: Diagnosis not present

## 2024-01-21 DIAGNOSIS — E038 Other specified hypothyroidism: Secondary | ICD-10-CM | POA: Diagnosis not present

## 2024-01-22 DIAGNOSIS — R7303 Prediabetes: Secondary | ICD-10-CM | POA: Diagnosis not present

## 2024-01-22 DIAGNOSIS — E038 Other specified hypothyroidism: Secondary | ICD-10-CM | POA: Diagnosis not present

## 2024-01-22 DIAGNOSIS — E7800 Pure hypercholesterolemia, unspecified: Secondary | ICD-10-CM | POA: Diagnosis not present

## 2024-01-22 DIAGNOSIS — R5383 Other fatigue: Secondary | ICD-10-CM | POA: Diagnosis not present

## 2024-01-26 DIAGNOSIS — G4733 Obstructive sleep apnea (adult) (pediatric): Secondary | ICD-10-CM | POA: Diagnosis not present

## 2024-01-30 ENCOUNTER — Ambulatory Visit (HOSPITAL_BASED_OUTPATIENT_CLINIC_OR_DEPARTMENT_OTHER): Admitting: Family

## 2024-01-30 ENCOUNTER — Encounter: Payer: Self-pay | Admitting: Pharmacist

## 2024-01-30 ENCOUNTER — Other Ambulatory Visit (HOSPITAL_COMMUNITY): Payer: Self-pay

## 2024-01-30 ENCOUNTER — Encounter (HOSPITAL_BASED_OUTPATIENT_CLINIC_OR_DEPARTMENT_OTHER): Payer: Self-pay | Admitting: Family

## 2024-01-30 ENCOUNTER — Other Ambulatory Visit: Payer: Self-pay

## 2024-01-30 ENCOUNTER — Telehealth (HOSPITAL_BASED_OUTPATIENT_CLINIC_OR_DEPARTMENT_OTHER): Payer: Self-pay | Admitting: Family

## 2024-01-30 VITALS — BP 106/62 | HR 68 | Ht 72.0 in | Wt 213.2 lb

## 2024-01-30 DIAGNOSIS — J439 Emphysema, unspecified: Secondary | ICD-10-CM

## 2024-01-30 DIAGNOSIS — I25118 Atherosclerotic heart disease of native coronary artery with other forms of angina pectoris: Secondary | ICD-10-CM | POA: Diagnosis not present

## 2024-01-30 DIAGNOSIS — I10 Essential (primary) hypertension: Secondary | ICD-10-CM

## 2024-01-30 DIAGNOSIS — R0609 Other forms of dyspnea: Secondary | ICD-10-CM | POA: Diagnosis not present

## 2024-01-30 DIAGNOSIS — E785 Hyperlipidemia, unspecified: Secondary | ICD-10-CM

## 2024-01-30 DIAGNOSIS — I998 Other disorder of circulatory system: Secondary | ICD-10-CM

## 2024-01-30 MED ORDER — ALBUTEROL SULFATE HFA 108 (90 BASE) MCG/ACT IN AERS: 2.0000 | INHALATION_SPRAY | Freq: Four times a day (QID) | RESPIRATORY_TRACT | 2 refills | Status: AC | PRN

## 2024-01-30 MED ORDER — IVABRADINE HCL 5 MG PO TABS
ORAL_TABLET | ORAL | 0 refills | Status: DC
  Filled 2024-01-30 (×2): qty 2, 1d supply, fill #0

## 2024-01-30 MED ORDER — ALBUTEROL SULFATE HFA 108 (90 BASE) MCG/ACT IN AERS
2.0000 | INHALATION_SPRAY | Freq: Four times a day (QID) | RESPIRATORY_TRACT | 2 refills | Status: DC | PRN
  Filled 2024-01-30: qty 6.7, 25d supply, fill #0

## 2024-01-30 NOTE — Patient Instructions (Signed)
 Medication Instructions:  Your physician has recommended you make the following change in your medication:   Albuterol - 2 pumps every 6 hours as needed for wheezing or Shortness of breath  *If you need a refill on your cardiac medications before your next appointment, please call your pharmacy*  Lab Work: Labs today- BMP & CBC   Testing/Procedures: Your physician has requested that you have a carotid duplex. This test is an ultrasound of the carotid arteries in your neck. It looks at blood flow through these arteries that supply the brain with blood. Allow one hour for this exam. There are no restrictions or special instructions.  Your physician has requested that you have an echocardiogram. Echocardiography is a painless test that uses sound waves to create images of your heart. It provides your doctor with information about the size and shape of your heart and how well your heart's chambers and valves are working. This procedure takes approximately one hour. There are no restrictions for this procedure. Please do NOT wear cologne, perfume, aftershave, or lotions (deodorant is allowed). Please arrive 15 minutes prior to your appointment time.  Please note: We ask at that you not bring children with you during ultrasound (echo/ vascular) testing. Due to room size and safety concerns, children are not allowed in the ultrasound rooms during exams. Our front office staff cannot provide observation of children in our lobby area while testing is being conducted. An adult accompanying a patient to their appointment will only be allowed in the ultrasound room at the discretion of the ultrasound technician under special circumstances. We apologize for any inconvenience.     Your cardiac CT will be scheduled at one of the below locations:   [facility] [address] [place], [place] [address] [phone]  Jeralene Mom. [facility] and Vascular Tower [address]  [place],  [place] [address] Opening January 26, 2024  If scheduled at [facility], please arrive at the [facility] and Children's Entrance (Entrance C2) of [facility] 30 minutes prior to test start time. You can use the FREE valet parking offered at entrance C (encouraged to control the heart rate for the test)  Proceed to the [facility] Radiology Department (first floor) to check-in and test prep.   All radiology patients and guests should use entrance C2 at [facility], accessed from [facility] even though the hospital's physical address listed is [address]    If scheduled at the Heart and Vascular Tower at [address], please enter the parking lot using the [address] entrance and use the FREE valet service at the patient drop-off area. Enter the buidling and check-in with registration on the main floor.  Please follow these instructions carefully (unless otherwise directed):  An IV will be required for this test and Nitroglycerin  will be given.  Hold all erectile dysfunction medications at least 3 days (72 hrs) prior to test. (Ie viagra, cialis, sildenafil, tadalafil, etc)   On the Night Before the Test: Be sure to Drink plenty of water. Do not consume any caffeinated/decaffeinated beverages or chocolate 12 hours prior to your test. Do not take any antihistamines 12 hours prior to your test.  On the Day of the Test: Drink plenty of water until 1 hour prior to the test. Do not eat any food 1 hour prior to test. You may take your regular medications prior to the test.  Take Ivabradine  10mg  ( 2tablets)  After the Test: Drink plenty of water. After receiving IV contrast, you may experience a mild flushed feeling. This is normal. On occasion, you may experience a mild rash up to 24 hours after the test. This is not dangerous. If this occurs, you can take Benadryl 25 mg, Zyrtec, Claritin, or Allegra and increase your fluid intake. (Patients  taking Tikosyn should avoid Benadryl, and may take Zyrtec, Claritin, or Allegra) If you experience trouble breathing, this can be serious. If it is severe call 911 IMMEDIATELY. If it is mild, please call our office.  We will call to schedule your test 2-4 weeks out understanding that some insurance companies will need an authorization prior to the service being performed.   For more information and frequently asked questions, please visit our website : [email or web address]  For non-scheduling related questions, please contact the cardiac imaging nurse navigator should you have any questions/concerns: Cardiac Imaging Nurse Navigators Direct Office Dial: [phone]   For scheduling needs, including cancellations and rescheduling, please call [place], [phone].   Follow-Up:  Your next appointment:   6 weeks with Dr. Chancy Comber or APP

## 2024-01-30 NOTE — Addendum Note (Signed)
 Addended by: Guss Legacy on: 01/30/2024 04:44 PM   Modules accepted: Orders

## 2024-01-30 NOTE — Progress Notes (Addendum)
 Cardiology Office Note:  .   Date:  01/30/2024  ID:  Justin Waters, DOB Nov 21, 1950, MRN [id number] PCP: Justin Lobstein, MD  Justin Waters Providers Cardiologist:  Justin Herrlich, MD    History of Present Illness: .   Justin Waters is a 73 y.o. male with history of moderate CAD (coronary CTA 08/2020 calcium  score 2111, manage medically), HTN, HLD, prediabetes, COPD, tobacco use, GERD.  Prior coronary CTA 08/2020 calcium  score 2111 placing the 95th percentile with plaque in LM, LAD, LCx, RCA though FFR hemodynamically significant only at distal LAD likely due to diffuse disease and small caliber distal LAD.  Echo at that time LVEF 65 to 70%, no RWMA, mild LVH,gr1dd, moderate MR, moderate to severe mitral annular calcification, mild aortic valve sclerosis without stenosis.  Carvedilol  previously discontinued with hypotension.  However due to mild hypertension HCTZ added to irbesartan .  Last seen 02/07/2023 by Justin Waters.  His LDL not at goal of less than 70 was recommended to add Zetia  10 mg daily.  ED visit 08/2023 with chest pain which was worth with inspiration.  Workup unremarkable with exception of CT angio chest/abdomen/pelvis inadvertent finding of stable scattered tiny sub-5 mm bilateral pulmonary nodules recommended for routine monitoring.  EKG independently reviewed reviewed NSR 79 bpm with no acute ST/T wave changes.  Discussed the use of AI scribe software for clinical note transcription with the patient, who gave verbal consent to proceed.  History of Present Illness Justin Waters is a 73 year old male with COPD who presents with DOE. He was referred by his primary care physician for a cardiology evaluation due to concerns about his DOE.  Exertional dyspnea has been present for six months, worsening with exertion, and is now noticeable with minimal activities such as walking to the car or moving around inside the house. There is no associated chest pain, swelling,  significant coughing, or wheezing. He denies lightheadedness, dizziness, or orthopnea. Of note, he has COPD but has never been on inhaler.   Blood pressure readings at home are typically around 120-130 mmHg systolic, with no significant differences between arms.   ROS: Please see the history of present illness.    All other systems reviewed and are negative.   Studies Reviewed: .        Cardiac Studies & Procedures   ______________________________________________________________________________________________     ECHOCARDIOGRAM  ECHOCARDIOGRAM COMPLETE 10/29/2021  Narrative ECHOCARDIOGRAM REPORT    Patient Name:   Justin Waters [facility] Date of Exam: 10/29/2021 Medical Rec #:  [id number]      Height:       72.0 in Accession #:    [id number]     Weight:       200.0 lb Date of Birth:  1950/11/13     BSA:          2.131 m Patient Age:    70 years       BP:           149/89 mmHg Patient Gender: M              HR:           62 bpm. Exam Location:  [address]  Procedure: 2D Echo, 3D Echo, Cardiac Doppler and Color Doppler  Indications:    I35.0 Aortic Stenosis  History:        Patient has prior history of Echocardiogram examinations, most recent 09/18/2020. CAD, COPD, Signs/Symptoms:Murmur; Risk Factors:Hypertension, Former Smoker and HLD.  Sonographer:  Justin Waters RCS Referring Phys: [phone] EMILY C MONGE  IMPRESSIONS   1. Left ventricular ejection fraction, by estimation, is 60 to 65%. Left ventricular ejection fraction by 3D volume is 62 %. The left ventricle has normal function. The left ventricle has no regional wall motion abnormalities. Left ventricular diastolic parameters are consistent with Grade I diastolic dysfunction (impaired relaxation). 2. Right ventricular systolic function is normal. The right ventricular size is normal. There is normal pulmonary artery systolic pressure. The estimated right ventricular systolic pressure is 31.1 mmHg. 3. The mitral valve is  grossly normal. No evidence of mitral valve regurgitation. Severe mitral annular calcification. Mitral valve area by 2.12 cm2. 4. The aortic valve is calcified. Aortic valve regurgitation is trivial. Aortic valve sclerosis is present, with no evidence of aortic valve stenosis.  Comparison(s): No significant change from prior study.  FINDINGS Left Ventricle: Left ventricular ejection fraction, by estimation, is 60 to 65%. Left ventricular ejection fraction by 3D volume is 62 %. The left ventricle has normal function. The left ventricle has no regional wall motion abnormalities. The left ventricular internal cavity size was small. There is no left ventricular hypertrophy. Left ventricular diastolic parameters are consistent with Grade I diastolic dysfunction (impaired relaxation).  Right Ventricle: The right ventricular size is normal. No increase in right ventricular wall thickness. Right ventricular systolic function is normal. There is normal pulmonary artery systolic pressure. The tricuspid regurgitant velocity is 2.65 m/s, and with an assumed right atrial pressure of 3 mmHg, the estimated right ventricular systolic pressure is 31.1 mmHg.  Left Atrium: Left atrial size was normal in size.  Right Atrium: Right atrial size was normal in size.  Pericardium: There is no evidence of pericardial effusion.  Mitral Valve: The mitral valve is abnormal. Severe mitral annular calcification. No evidence of mitral valve regurgitation. MV peak gradient, 7.8 mmHg. The mean mitral valve gradient is 3.0 mmHg with average heart rate of 60 bpm.  Tricuspid Valve: The tricuspid valve is normal in structure. Tricuspid valve regurgitation is mild . No evidence of tricuspid stenosis.  Aortic Valve: The aortic valve is calcified. Aortic valve regurgitation is trivial. Aortic regurgitation PHT measures 1039 msec. Aortic valve sclerosis is present, with no evidence of aortic valve stenosis.  Pulmonic Valve: The  pulmonic valve was normal in structure. Pulmonic valve regurgitation is not visualized. No evidence of pulmonic stenosis.  Aorta: The aortic root and ascending aorta are structurally normal, with no evidence of dilitation.  IAS/Shunts: No atrial level shunt detected by color flow Doppler.   LEFT VENTRICLE PLAX 2D LVIDd:         3.50 cm         Diastology LVIDs:         2.50 cm         LV e' medial:    7.51 cm/s LV PW:         1.00 cm         LV E/e' medial:  14.5 LV IVS:        1.00 cm         LV e' lateral:   8.70 cm/s LVOT diam:     2.00 cm         LV E/e' lateral: 12.5 LV SV:         90 LV SV Index:   42 LVOT Area:     3.14 cm        3D Volume EF LV 3D EF:  Left ventricul ar ejection fraction by 3D volume is 62 %.  3D Volume EF: 3D EF:        62 % LV EDV:       104 ml LV ESV:       40 ml LV SV:        64 ml  RIGHT VENTRICLE RV Basal diam:  3.60 cm RV S prime:     9.90 cm/s RVSP:           31.1 mmHg  LEFT ATRIUM           Index        RIGHT ATRIUM           Index LA diam:      3.30 cm 1.55 cm/m   RA Pressure: 3.00 mmHg LA Vol (A4C): 55.2 ml 25.91 ml/m  RA Area:     20.20 cm RA Volume:   62.20 ml  29.19 ml/m AORTIC VALVE LVOT Vmax:   121.00 cm/s LVOT Vmean:  87.300 cm/s LVOT VTI:    0.285 m AI PHT:      1039 msec  AORTA Ao Root diam: 3.40 cm Ao Asc diam:  3.60 cm  MITRAL VALVE                TRICUSPID VALVE MV Area (PHT): 1.74 cm     TR Peak grad:   28.1 mmHg MV Area VTI:   2.06 cm     TR Vmax:        265.00 cm/s MV Peak grad:  7.8 mmHg     Estimated RAP:  3.00 mmHg MV Mean grad:  3.0 mmHg     RVSP:           31.1 mmHg MV Vmax:       1.40 m/s MV Vmean:      81.9 cm/s    SHUNTS MV Decel Time: 437 msec     Systemic VTI:  0.29 m MV E velocity: 109.00 cm/s  Systemic Diam: 2.00 cm MV A velocity: 125.00 cm/s MV E/A ratio:  0.87  Gloriann Larger MD Electronically signed by Gloriann Larger MD Signature Date/Time: 10/29/2021/11:23:59  AM    Final      CT SCANS  CT CORONARY FRACTIONAL FLOW RESERVE DATA PREP 09/19/2020  Narrative EXAM: CT FFR ANALYSIS  CLINICAL DATA:  Chest pain, nonspecific  FINDINGS: FFRct analysis was performed on the original cardiac CT angiogram dataset. Diagrammatic representation of the FFRct analysis is provided in a separate PDF document in PACS. This dictation was created using the PDF document and an interactive 3D model of the results. 3D model is not available in the EMR/PACS. Normal FFR range is >0.80. Indeterminate (grey) zone is 0.76-0.80.  1. Left Main: FFR = 0.97  2. LAD: Proximal FFR = 0.87, Mid FFR = 0.82, Distal FFR = 0.72 3. LCX: Proximal FFR = 0.91, Distal FFR = 0.86 4. RCA: Proximal FFR = 0.97, Mid FFR =0.95, Distal FFR = 0.88  IMPRESSION: 1. CT FFR analysis showed a hemodynamically significant FFR value in the distal LAD. This is likely due to diffuse disease in the small caliber distal LAD.  RECOMMENDATIONS: Guideline-directed medical therapy and aggressive risk factor modification for secondary prevention of coronary artery disease.   Electronically Signed By: Grady Lawman On: 09/20/2020 10:31   CT CORONARY MORPH W/CTA COR W/SCORE 09/19/2020  Addendum 09/20/2020 10:23 AM ADDENDUM REPORT: 09/20/2020 10:21  HISTORY: Chest pain, nonspecific  EXAM: Cardiac/Coronary  CT  TECHNIQUE: The patient was scanned on a [employer].  PROTOCOL: A 120 kV prospective scan was triggered in the descending thoracic aorta at 111 HU's. Axial non-contrast 3 mm slices were carried out through the heart. The data set was analyzed on a dedicated work station and scored using the Agatston method. Gantry rotation speed was 250 msecs and collimation was .6 mm. Beta blockade and 0.8 mg of sl NTG was given. The 3D data set was reconstructed in 5% intervals of the 67-82 % of the R-R cycle. Diastolic phases were analyzed on a dedicated work station using  MPR, MIP and VRT modes. The patient received 80mL OMNIPAQUE  IOHEXOL  350 MG/ML SOLN of contrast.  FINDINGS: Image quality: good  Noise artifact is: Moderate, calcium  blooming artifact.  Coronary calcium  score is 2111, which places the patient in the 95th percentile for age and sex matched control.  Coronary arteries: Normal coronary origins.  Right dominance.  Right Coronary Artery: Mild mixed atherosclerotic plaque in the proximal RCA, 25-49% stenosis. Serial moderate mixed atherosclerotic stenoses in the mid RCA, 50-69% stenosis. Mild mixed atherosclerotic plaque in the distal RCA, 25-49% stenosis.  Left Main Coronary Artery: Minimal ostial left main mixed atherosclerotic plaque, <25% stenosis. Mild mixed atherosclerotic plaque in the distal left main coronary artery, 25-49% stenosis.  Left Anterior Descending Coronary Artery: Wrap around LAD. Moderate proximal, mid, and distal LAD mixed atherosclerotic plaque, 50-69% stenosis. The portion of the distal LAD that wraps around the LV apex is diffusely diseased. Moderate mixed atherosclerotic plaque in the first diagonal artery, 50-69% stenosis.  Left Circumflex Artery: Mild mixed atherosclerotic plaque in the proximal L circumflex artery, 25-49% stenosis. After the second OM, the distal L circumflex as two serial moderate mixed atherosclerotic plaques, 50-69% stenosis.  Aorta: Normal size, 36 mm at the mid ascending aorta (level of the PA bifurcation) measured double oblique. Mild calcifications. No dissection.  Aortic Valve: Moderate calcifications.  AV calcium  score 748.  Other findings:  Normal pulmonary vein drainage into the left atrium.  Normal left atrial appendage without a thrombus.  Normal size of the pulmonary artery.  Mild mitral annular calcifications.  IMPRESSION: 1. Moderate CAD, CADRADS = 3. CT FFR will be performed and reported separately.  2. Coronary calcium  score is 2111, which places the  patient in the 95th percentile for age and sex matched control.  3. Normal coronary origin with right dominance.   Electronically Signed By: Grady Lawman On: 09/20/2020 10:21  Narrative EXAM: OVER-READ INTERPRETATION  CT CHEST  The following report is an over-read performed by radiologist Dr. Zara Heymann of [facility] on 09/19/2020. This over-read does not include interpretation of cardiac or coronary anatomy or pathology. The coronary calcium  score/coronary CTA interpretation by the cardiologist is attached.  COMPARISON:  None  FINDINGS: Vascular: Calcified atheromatous plaque of the thoracic aorta and coronary artery calcifications, see dedicated CT angiography report and coronary calcium  scoring.  Mediastinum/Nodes: Limited visualization of mediastinal structures without signs of adenopathy. Visualized esophagus is grossly normal. No hilar adenopathy within the visualized portions of the hila bilaterally.  Lungs/Pleura: Moderate pulmonary emphysema. No consolidation. No pleural effusion. Lungs are incompletely imaged.  Small RIGHT upper lobe pulmonary nodule (image 17, series 12) 3 mm.  Small RIGHT middle lobe pulmonary nodule in the RIGHT costodiaphragmatic recess 4 mm (image 33, series 12)  Upper Abdomen: No acute upper abdominal process.  Musculoskeletal: No acute bone finding or destructive bone process. Spinal degenerative changes.  IMPRESSION: 1. Moderate pulmonary  emphysema. 2. Small pulmonary nodules in the RIGHT chest as described largest 4 mm. Non-contrast chest CT can be considered in 12 months in this high risk patient. This recommendation follows the consensus statement: Guidelines for Management of Incidental Pulmonary Nodules Detected on CT Images: From the Fleischner Society 2017; Radiology 2017; 284:228-243. Is 3. Calcified atheromatous plaque of the thoracic aorta and coronary artery calcifications, see dedicated CT  angiography report and coronary calcium  scoring.  Aortic Atherosclerosis (ICD10-I70.0) and Emphysema (ICD10-J43.9).  Electronically Signed: By: Zara Heymann M.D. On: 09/19/2020 09:45     ______________________________________________________________________________________________      Risk Assessment/Calculations:             Physical Exam:   VS:  BP 106/62 Comment: left arm  Pulse 68   Ht 6' (1.829 m)   Wt 213 lb 3.2 oz (96.7 kg)   SpO2 95%   BMI 28.92 kg/m    Wt Readings from Last 3 Encounters:  01/30/24 213 lb 3.2 oz (96.7 kg)  09/20/23 205 lb (93 kg)  02/07/23 198 lb 6.4 oz (90 kg)    GEN: Well nourished, well developed in no acute distress NECK: No JVD; No carotid bruits CARDIAC: RRR, no murmurs, rubs, gallops RESPIRATORY:  Clear to auscultation without rales, wheezing or rhonchi  ABDOMEN: Soft, non-tender, non-distended EXTREMITIES:  No edema; No deformity   ASSESSMENT AND PLAN: .    Assessment & Plan CAD / COPD / Exertional dyspnea Prior coronary CTA 08/2020 calcium  score 2111 placing the 95th percentile with plaque in LM, LAD, LCx, RCA though FFR hemodynamically significant only at distal LAD likely due to diffuse disease and small caliber distal LAD.  DOE ongoing 6 months and worsening. No corresponding chest pain, edema, orthopnea, nor PND. Differential includes angina vs heart failure vs COPD vs deconditioning.  - Addendum: after clinic visit reviewed with Dr. Chancy Comber. Given nature of symptoms, high probability of obstructive disease will plan for LHC for ischemic eval. See phone note 01/30/24 for further details.  - Order echocardiogram to evaluate heart muscle and valve function. - Lab work today: CBC to rule out anemia, BMET to assess renal function prior to CTA - Rx Albuterol  PRN for dyspnea  Mild to moderate MR/aortic valve sclerosis Update echo due to DOE, as above.  No significant murmur appreciated on exam.  Hypertension /asymmetric BP Blood  pressure typically 120-130 mmHg systolic at home.  Initial BP in clinic left arm 90/60 with right arm 130/88.  Repeat left arm without intervention 106/62.  No evidence, dizziness, near STEMI, syncope.  No bruit on exam.  CT angio chest 08/2023 no evidence of aortic coarctation. - Plan for carotid duplex for further evaluation.  Hyperlipidemia, LDL goal less than 70 LDL cholesterol improved to 45 mg/dL with Zetia , continue Zetia  10 mg daily, rosuvastatin  40 mg daily. Chronic Obstructive Pulmonary Disease (COPD) Shortness of breath on exertion. Inhaler use may help differentiate cardiac from pulmonary causes. - Prescribe albuterol  inhaler for as-needed use up to every 6 hours.        Dispo: follow up in 6-8 weeks  Signed, Clearnce Curia, NP

## 2024-01-30 NOTE — Telephone Encounter (Signed)
 Case reviewed with Dr. Chancy Comber after clinic visit. Given nature of symptoms, high probability of obstructive disease plan for LHC instead of cardiac CTA. Called [name] to discuss and left detailed VM requesting call back.   Justin Janish S Holman Bonsignore, NP

## 2024-01-30 NOTE — H&P (View-Only) (Signed)
 Cardiology Office Note:  .   Date:  01/30/2024  ID:  Justin Waters, DOB Nov 21, 1950, MRN [id number] PCP: Helyn Lobstein, MD  [facility] Providers Cardiologist:  Euell Herrlich, MD    History of Present Illness: .   Justin Waters is a 73 y.o. male with history of moderate CAD (coronary CTA 08/2020 calcium  score 2111, manage medically), HTN, HLD, prediabetes, COPD, tobacco use, GERD.  Prior coronary CTA 08/2020 calcium  score 2111 placing the 95th percentile with plaque in LM, LAD, LCx, RCA though FFR hemodynamically significant only at distal LAD likely due to diffuse disease and small caliber distal LAD.  Echo at that time LVEF 65 to 70%, no RWMA, mild LVH,gr1dd, moderate MR, moderate to severe mitral annular calcification, mild aortic valve sclerosis without stenosis.  Carvedilol  previously discontinued with hypotension.  However due to mild hypertension HCTZ added to irbesartan .  Last seen 02/07/2023 by Dr. Acharya.  His LDL not at goal of less than 70 was recommended to add Zetia  10 mg daily.  ED visit 08/2023 with chest pain which was worth with inspiration.  Workup unremarkable with exception of CT angio chest/abdomen/pelvis inadvertent finding of stable scattered tiny sub-5 mm bilateral pulmonary nodules recommended for routine monitoring.  EKG independently reviewed reviewed NSR 79 bpm with no acute ST/T wave changes.  Discussed the use of AI scribe software for clinical note transcription with the patient, who gave verbal consent to proceed.  History of Present Illness Justin Waters is a 73 year old male with COPD who presents with DOE. He was referred by his primary care physician for a cardiology evaluation due to concerns about his DOE.  Exertional dyspnea has been present for six months, worsening with exertion, and is now noticeable with minimal activities such as walking to the car or moving around inside the house. There is no associated chest pain, swelling,  significant coughing, or wheezing. He denies lightheadedness, dizziness, or orthopnea. Of note, he has COPD but has never been on inhaler.   Blood pressure readings at home are typically around 120-130 mmHg systolic, with no significant differences between arms.   ROS: Please see the history of present illness.    All other systems reviewed and are negative.   Studies Reviewed: .        Cardiac Studies & Procedures   ______________________________________________________________________________________________     ECHOCARDIOGRAM  ECHOCARDIOGRAM COMPLETE 10/29/2021  Narrative ECHOCARDIOGRAM REPORT    Patient Name:   Justin Waters [facility] Date of Exam: 10/29/2021 Medical Rec #:  [id number]      Height:       72.0 in Accession #:    [id number]     Weight:       200.0 lb Date of Birth:  1950/11/13     BSA:          2.131 m Patient Age:    70 years       BP:           149/89 mmHg Patient Gender: M              HR:           62 bpm. Exam Location:  [address]  Procedure: 2D Echo, 3D Echo, Cardiac Doppler and Color Doppler  Indications:    I35.0 Aortic Stenosis  History:        Patient has prior history of Echocardiogram examinations, most recent 09/18/2020. CAD, COPD, Signs/Symptoms:Murmur; Risk Factors:Hypertension, Former Smoker and HLD.  Sonographer:  Juventino Oppenheim RCS Referring Phys: [phone] EMILY C MONGE  IMPRESSIONS   1. Left ventricular ejection fraction, by estimation, is 60 to 65%. Left ventricular ejection fraction by 3D volume is 62 %. The left ventricle has normal function. The left ventricle has no regional wall motion abnormalities. Left ventricular diastolic parameters are consistent with Grade I diastolic dysfunction (impaired relaxation). 2. Right ventricular systolic function is normal. The right ventricular size is normal. There is normal pulmonary artery systolic pressure. The estimated right ventricular systolic pressure is 31.1 mmHg. 3. The mitral valve is  grossly normal. No evidence of mitral valve regurgitation. Severe mitral annular calcification. Mitral valve area by 2.12 cm2. 4. The aortic valve is calcified. Aortic valve regurgitation is trivial. Aortic valve sclerosis is present, with no evidence of aortic valve stenosis.  Comparison(s): No significant change from prior study.  FINDINGS Left Ventricle: Left ventricular ejection fraction, by estimation, is 60 to 65%. Left ventricular ejection fraction by 3D volume is 62 %. The left ventricle has normal function. The left ventricle has no regional wall motion abnormalities. The left ventricular internal cavity size was small. There is no left ventricular hypertrophy. Left ventricular diastolic parameters are consistent with Grade I diastolic dysfunction (impaired relaxation).  Right Ventricle: The right ventricular size is normal. No increase in right ventricular wall thickness. Right ventricular systolic function is normal. There is normal pulmonary artery systolic pressure. The tricuspid regurgitant velocity is 2.65 m/s, and with an assumed right atrial pressure of 3 mmHg, the estimated right ventricular systolic pressure is 31.1 mmHg.  Left Atrium: Left atrial size was normal in size.  Right Atrium: Right atrial size was normal in size.  Pericardium: There is no evidence of pericardial effusion.  Mitral Valve: The mitral valve is abnormal. Severe mitral annular calcification. No evidence of mitral valve regurgitation. MV peak gradient, 7.8 mmHg. The mean mitral valve gradient is 3.0 mmHg with average heart rate of 60 bpm.  Tricuspid Valve: The tricuspid valve is normal in structure. Tricuspid valve regurgitation is mild . No evidence of tricuspid stenosis.  Aortic Valve: The aortic valve is calcified. Aortic valve regurgitation is trivial. Aortic regurgitation PHT measures 1039 msec. Aortic valve sclerosis is present, with no evidence of aortic valve stenosis.  Pulmonic Valve: The  pulmonic valve was normal in structure. Pulmonic valve regurgitation is not visualized. No evidence of pulmonic stenosis.  Aorta: The aortic root and ascending aorta are structurally normal, with no evidence of dilitation.  IAS/Shunts: No atrial level shunt detected by color flow Doppler.   LEFT VENTRICLE PLAX 2D LVIDd:         3.50 cm         Diastology LVIDs:         2.50 cm         LV e' medial:    7.51 cm/s LV PW:         1.00 cm         LV E/e' medial:  14.5 LV IVS:        1.00 cm         LV e' lateral:   8.70 cm/s LVOT diam:     2.00 cm         LV E/e' lateral: 12.5 LV SV:         90 LV SV Index:   42 LVOT Area:     3.14 cm        3D Volume EF LV 3D EF:  Left ventricul ar ejection fraction by 3D volume is 62 %.  3D Volume EF: 3D EF:        62 % LV EDV:       104 ml LV ESV:       40 ml LV SV:        64 ml  RIGHT VENTRICLE RV Basal diam:  3.60 cm RV S prime:     9.90 cm/s RVSP:           31.1 mmHg  LEFT ATRIUM           Index        RIGHT ATRIUM           Index LA diam:      3.30 cm 1.55 cm/m   RA Pressure: 3.00 mmHg LA Vol (A4C): 55.2 ml 25.91 ml/m  RA Area:     20.20 cm RA Volume:   62.20 ml  29.19 ml/m AORTIC VALVE LVOT Vmax:   121.00 cm/s LVOT Vmean:  87.300 cm/s LVOT VTI:    0.285 m AI PHT:      1039 msec  AORTA Ao Root diam: 3.40 cm Ao Asc diam:  3.60 cm  MITRAL VALVE                TRICUSPID VALVE MV Area (PHT): 1.74 cm     TR Peak grad:   28.1 mmHg MV Area VTI:   2.06 cm     TR Vmax:        265.00 cm/s MV Peak grad:  7.8 mmHg     Estimated RAP:  3.00 mmHg MV Mean grad:  3.0 mmHg     RVSP:           31.1 mmHg MV Vmax:       1.40 m/s MV Vmean:      81.9 cm/s    SHUNTS MV Decel Time: 437 msec     Systemic VTI:  0.29 m MV E velocity: 109.00 cm/s  Systemic Diam: 2.00 cm MV A velocity: 125.00 cm/s MV E/A ratio:  0.87  Gloriann Larger MD Electronically signed by Gloriann Larger MD Signature Date/Time: 10/29/2021/11:23:59  AM    Final      CT SCANS  CT CORONARY FRACTIONAL FLOW RESERVE DATA PREP 09/19/2020  Narrative EXAM: CT FFR ANALYSIS  CLINICAL DATA:  Chest pain, nonspecific  FINDINGS: FFRct analysis was performed on the original cardiac CT angiogram dataset. Diagrammatic representation of the FFRct analysis is provided in a separate PDF document in PACS. This dictation was created using the PDF document and an interactive 3D model of the results. 3D model is not available in the EMR/PACS. Normal FFR range is >0.80. Indeterminate (grey) zone is 0.76-0.80.  1. Left Main: FFR = 0.97  2. LAD: Proximal FFR = 0.87, Mid FFR = 0.82, Distal FFR = 0.72 3. LCX: Proximal FFR = 0.91, Distal FFR = 0.86 4. RCA: Proximal FFR = 0.97, Mid FFR =0.95, Distal FFR = 0.88  IMPRESSION: 1. CT FFR analysis showed a hemodynamically significant FFR value in the distal LAD. This is likely due to diffuse disease in the small caliber distal LAD.  RECOMMENDATIONS: Guideline-directed medical therapy and aggressive risk factor modification for secondary prevention of coronary artery disease.   Electronically Signed By: Grady Lawman On: 09/20/2020 10:31   CT CORONARY MORPH W/CTA COR W/SCORE 09/19/2020  Addendum 09/20/2020 10:23 AM ADDENDUM REPORT: 09/20/2020 10:21  HISTORY: Chest pain, nonspecific  EXAM: Cardiac/Coronary  CT  TECHNIQUE: The patient was scanned on a [employer].  PROTOCOL: A 120 kV prospective scan was triggered in the descending thoracic aorta at 111 HU's. Axial non-contrast 3 mm slices were carried out through the heart. The data set was analyzed on a dedicated work station and scored using the Agatston method. Gantry rotation speed was 250 msecs and collimation was .6 mm. Beta blockade and 0.8 mg of sl NTG was given. The 3D data set was reconstructed in 5% intervals of the 67-82 % of the R-R cycle. Diastolic phases were analyzed on a dedicated work station using  MPR, MIP and VRT modes. The patient received 80mL OMNIPAQUE  IOHEXOL  350 MG/ML SOLN of contrast.  FINDINGS: Image quality: good  Noise artifact is: Moderate, calcium  blooming artifact.  Coronary calcium  score is 2111, which places the patient in the 95th percentile for age and sex matched control.  Coronary arteries: Normal coronary origins.  Right dominance.  Right Coronary Artery: Mild mixed atherosclerotic plaque in the proximal RCA, 25-49% stenosis. Serial moderate mixed atherosclerotic stenoses in the mid RCA, 50-69% stenosis. Mild mixed atherosclerotic plaque in the distal RCA, 25-49% stenosis.  Left Main Coronary Artery: Minimal ostial left main mixed atherosclerotic plaque, <25% stenosis. Mild mixed atherosclerotic plaque in the distal left main coronary artery, 25-49% stenosis.  Left Anterior Descending Coronary Artery: Wrap around LAD. Moderate proximal, mid, and distal LAD mixed atherosclerotic plaque, 50-69% stenosis. The portion of the distal LAD that wraps around the LV apex is diffusely diseased. Moderate mixed atherosclerotic plaque in the first diagonal artery, 50-69% stenosis.  Left Circumflex Artery: Mild mixed atherosclerotic plaque in the proximal L circumflex artery, 25-49% stenosis. After the second OM, the distal L circumflex as two serial moderate mixed atherosclerotic plaques, 50-69% stenosis.  Aorta: Normal size, 36 mm at the mid ascending aorta (level of the PA bifurcation) measured double oblique. Mild calcifications. No dissection.  Aortic Valve: Moderate calcifications.  AV calcium  score 748.  Other findings:  Normal pulmonary vein drainage into the left atrium.  Normal left atrial appendage without a thrombus.  Normal size of the pulmonary artery.  Mild mitral annular calcifications.  IMPRESSION: 1. Moderate CAD, CADRADS = 3. CT FFR will be performed and reported separately.  2. Coronary calcium  score is 2111, which places the  patient in the 95th percentile for age and sex matched control.  3. Normal coronary origin with right dominance.   Electronically Signed By: Grady Lawman On: 09/20/2020 10:21  Narrative EXAM: OVER-READ INTERPRETATION  CT CHEST  The following report is an over-read performed by radiologist Dr. Zara Heymann of [facility] on 09/19/2020. This over-read does not include interpretation of cardiac or coronary anatomy or pathology. The coronary calcium  score/coronary CTA interpretation by the cardiologist is attached.  COMPARISON:  None  FINDINGS: Vascular: Calcified atheromatous plaque of the thoracic aorta and coronary artery calcifications, see dedicated CT angiography report and coronary calcium  scoring.  Mediastinum/Nodes: Limited visualization of mediastinal structures without signs of adenopathy. Visualized esophagus is grossly normal. No hilar adenopathy within the visualized portions of the hila bilaterally.  Lungs/Pleura: Moderate pulmonary emphysema. No consolidation. No pleural effusion. Lungs are incompletely imaged.  Small RIGHT upper lobe pulmonary nodule (image 17, series 12) 3 mm.  Small RIGHT middle lobe pulmonary nodule in the RIGHT costodiaphragmatic recess 4 mm (image 33, series 12)  Upper Abdomen: No acute upper abdominal process.  Musculoskeletal: No acute bone finding or destructive bone process. Spinal degenerative changes.  IMPRESSION: 1. Moderate pulmonary  emphysema. 2. Small pulmonary nodules in the RIGHT chest as described largest 4 mm. Non-contrast chest CT can be considered in 12 months in this high risk patient. This recommendation follows the consensus statement: Guidelines for Management of Incidental Pulmonary Nodules Detected on CT Images: From the Fleischner Society 2017; Radiology 2017; 284:228-243. Is 3. Calcified atheromatous plaque of the thoracic aorta and coronary artery calcifications, see dedicated CT  angiography report and coronary calcium  scoring.  Aortic Atherosclerosis (ICD10-I70.0) and Emphysema (ICD10-J43.9).  Electronically Signed: By: Zara Heymann M.D. On: 09/19/2020 09:45     ______________________________________________________________________________________________      Risk Assessment/Calculations:             Physical Exam:   VS:  BP 106/62 Comment: left arm  Pulse 68   Ht 6' (1.829 m)   Wt 213 lb 3.2 oz (96.7 kg)   SpO2 95%   BMI 28.92 kg/m    Wt Readings from Last 3 Encounters:  01/30/24 213 lb 3.2 oz (96.7 kg)  09/20/23 205 lb (93 kg)  02/07/23 198 lb 6.4 oz (90 kg)    GEN: Well nourished, well developed in no acute distress NECK: No JVD; No carotid bruits CARDIAC: RRR, no murmurs, rubs, gallops RESPIRATORY:  Clear to auscultation without rales, wheezing or rhonchi  ABDOMEN: Soft, non-tender, non-distended EXTREMITIES:  No edema; No deformity   ASSESSMENT AND PLAN: .    Assessment & Plan CAD / COPD / Exertional dyspnea Prior coronary CTA 08/2020 calcium  score 2111 placing the 95th percentile with plaque in LM, LAD, LCx, RCA though FFR hemodynamically significant only at distal LAD likely due to diffuse disease and small caliber distal LAD.  DOE ongoing 6 months and worsening. No corresponding chest pain, edema, orthopnea, nor PND. Differential includes angina vs heart failure vs COPD vs deconditioning.  - Addendum: after clinic visit reviewed with Dr. Chancy Comber. Given nature of symptoms, high probability of obstructive disease will plan for LHC for ischemic eval. See phone note 01/30/24 for further details.  - Order echocardiogram to evaluate heart muscle and valve function. - Lab work today: CBC to rule out anemia, BMET to assess renal function prior to CTA - Rx Albuterol  PRN for dyspnea  Mild to moderate MR/aortic valve sclerosis Update echo due to DOE, as above.  No significant murmur appreciated on exam.  Hypertension /asymmetric BP Blood  pressure typically 120-130 mmHg systolic at home.  Initial BP in clinic left arm 90/60 with right arm 130/88.  Repeat left arm without intervention 106/62.  No evidence, dizziness, near STEMI, syncope.  No bruit on exam.  CT angio chest 08/2023 no evidence of aortic coarctation. - Plan for carotid duplex for further evaluation.  Hyperlipidemia, LDL goal less than 70 LDL cholesterol improved to 45 mg/dL with Zetia , continue Zetia  10 mg daily, rosuvastatin  40 mg daily. Chronic Obstructive Pulmonary Disease (COPD) Shortness of breath on exertion. Inhaler use may help differentiate cardiac from pulmonary causes. - Prescribe albuterol  inhaler for as-needed use up to every 6 hours.        Dispo: follow up in 6-8 weeks  Signed, Clearnce Curia, NP

## 2024-01-30 NOTE — Telephone Encounter (Signed)
 CTA cancelled

## 2024-01-31 LAB — BASIC METABOLIC PANEL WITH GFR
BUN/Creatinine Ratio: 20 (ref 10–24)
BUN: 28 mg/dL — ABNORMAL HIGH (ref 8–27)
CO2: 21 mmol/L (ref 20–29)
Calcium: 10.4 mg/dL — ABNORMAL HIGH (ref 8.6–10.2)
Chloride: 102 mmol/L (ref 96–106)
Creatinine, Ser: 1.38 mg/dL — ABNORMAL HIGH (ref 0.76–1.27)
Glucose: 111 mg/dL — ABNORMAL HIGH (ref 70–99)
Potassium: 5.6 mmol/L — ABNORMAL HIGH (ref 3.5–5.2)
Sodium: 140 mmol/L (ref 134–144)
eGFR: 55 mL/min/{1.73_m2} — ABNORMAL LOW (ref >59)

## 2024-01-31 LAB — CBC
Hematocrit: 44.9 % (ref 37.5–51.0)
Hemoglobin: 14.3 g/dL (ref 13.0–17.7)
MCH: 28.5 pg (ref 26.6–33.0)
MCHC: 31.8 g/dL (ref 31.5–35.7)
MCV: 90 fL (ref 79–97)
Platelets: 270 10*3/uL (ref 150–450)
RBC: 5.01 x10E6/uL (ref 4.14–5.80)
RDW: 12.2 % (ref 11.6–15.4)
WBC: 8.4 10*3/uL (ref 3.4–10.8)

## 2024-02-02 ENCOUNTER — Encounter (HOSPITAL_BASED_OUTPATIENT_CLINIC_OR_DEPARTMENT_OTHER): Payer: Self-pay

## 2024-02-02 ENCOUNTER — Other Ambulatory Visit (HOSPITAL_BASED_OUTPATIENT_CLINIC_OR_DEPARTMENT_OTHER): Payer: Self-pay | Admitting: Family

## 2024-02-02 NOTE — Addendum Note (Signed)
 Addended by: Zorita Hiss on: 02/02/2024 01:44 PM   Modules accepted: Orders

## 2024-02-02 NOTE — Telephone Encounter (Signed)
 Called and spoke to [place] in cath lab. Pt is scheduled for LHC on 5/8 @ 9:00 with Dr. Nolan Battle. Pt is to arrive at 7:00. Will call pt to confirm info and explain LHC instructions.

## 2024-02-02 NOTE — Telephone Encounter (Signed)
 Spoke with Mr. Horrocks. Reviewed indication for LHC. He is agreeable.   Reviewed lab results. Will hold Irbesartan  x 2 days then resume at 150mg  daily.   Plan for repeat BMET Wednesday 02/04/24. Plan for [facility] Thursday 02/05/24 with Dr. Nolan Battle. Will ask nursing team to assist in calling to schedule.   Informed Consent   Shared Decision Making/Informed Consent The risks [stroke (1 in 1000), death (1 in 1000), kidney failure [usually temporary] (1 in 500), bleeding (1 in 200), allergic reaction [possibly serious] (1 in 200)], benefits (diagnostic support and management of coronary artery disease) and alternatives of a cardiac catheterization were discussed in detail with Mr. Savard and he is willing to proceed.    Elzia Hott S Mackinley Cassaday, NP

## 2024-02-02 NOTE — Telephone Encounter (Addendum)
 Called and spoke to pt. Discussed LHC instructions and informed that if he any further questions to call office or message back on [facility]. Pt also made aware of BMET needed on Wednesday 5/7. LHC info sent to pt via [facility] as well.

## 2024-02-02 NOTE — Telephone Encounter (Signed)
  [facility] & VASCULAR AT [facility] [address] Justin Waters [address] [place] [address] Dept: [phone]  Justin Waters [facility]  02/02/2024  You are scheduled for a Cardiac Catheterization on Thursday, May 8 with Dr. Veryl Gottron End.  1. Please arrive at the [facility] (Main Entrance A) at [facility]: [address] [place], [place] [address] at 7:00 AM (This time is 2 hour(s) before your procedure to ensure your preparation).   Free valet parking service is available. You will check in at ADMITTING. The support person will be asked to wait in the waiting room.  It is OK to have someone drop you off and come back when you are ready to be discharged.    Special note: Every effort is made to have your procedure done on time. Please understand that emergencies sometimes delay scheduled procedures.  2. Diet: Do not eat solid foods after midnight.  The patient may have clear liquids until 5am upon the day of the procedure.  3. Labs: You will need to have blood drawn on Wednesday, May 7 at [facility] at [facility]:  [address] [place], [place] [address] (lab is in the Primary Care office located on the 3rd floor).  Hours: 8:00 am - 4:30 pm (avoid 12:00 pm - 1:00 pm)  Phone: [phone].  Tell staff you are there for blood work and they will direct you to the lab.  You do not need an appointment. You do not need to be fasting.  4. Medication instructions in preparation for your procedure:   Contrast Allergy: No  Stop taking, Irbesartan  Tuesday, May 6,  Do not take Diabetes Med Glucophage  (Metformin ) on the day of the procedure and HOLD 48 HOURS AFTER THE PROCEDURE.  On the morning of your procedure, take your Aspirin  81 mg and any morning medicines NOT listed above.  You may use sips of water.  5. Plan to go home the same day, you will only stay overnight if medically necessary. 6. Bring a current list  of your medications and current insurance cards. 7. You MUST have a responsible person to drive you home. 8. Someone MUST be with you the first 24 hours after you arrive home or your discharge will be delayed. 9. Please wear clothes that are easy to get on and off and wear slip-on shoes.  Thank you for allowing us  to care for you!   -- [facility]

## 2024-02-02 NOTE — Telephone Encounter (Signed)
 Called and left VM requesting call back.   Shiela Bruns S Jobany Montellano, NP

## 2024-02-03 ENCOUNTER — Telehealth: Payer: Self-pay | Admitting: *Deleted

## 2024-02-03 NOTE — Telephone Encounter (Signed)
 Cardiac Catheterization scheduled at [facility] for: Thursday Feb 05, 2024 9 AM Arrival time [facility] Main Entrance A at: 7 AM  Nothing to eat after midnight prior to procedure, clear liquids until 5 AM day of procedure.  Medication instructions: -Hold:  Metformin -day of procedure and 48 hours post procedure  Irbesartan -on hold -see 01/30/24 BMP results -Other usual morning medications can be taken with sips of water including aspirin  81 mg.  Plan to go home the same day, you will only stay overnight if medically necessary.  You must have responsible adult to drive you home.  Someone must be with you the first 24 hours after you arrive home.  Reviewed procedure instructions with patient.

## 2024-02-04 ENCOUNTER — Other Ambulatory Visit: Payer: Self-pay

## 2024-02-04 ENCOUNTER — Other Ambulatory Visit (HOSPITAL_BASED_OUTPATIENT_CLINIC_OR_DEPARTMENT_OTHER): Payer: Self-pay

## 2024-02-04 ENCOUNTER — Other Ambulatory Visit (HOSPITAL_COMMUNITY): Payer: Self-pay | Admitting: Family Medicine

## 2024-02-04 DIAGNOSIS — I10 Essential (primary) hypertension: Secondary | ICD-10-CM | POA: Diagnosis not present

## 2024-02-04 LAB — BASIC METABOLIC PANEL WITH GFR
BUN/Creatinine Ratio: 20 (ref 10–24)
BUN: 27 mg/dL (ref 8–27)
CO2: 20 mmol/L (ref 20–29)
Calcium: 9.9 mg/dL (ref 8.6–10.2)
Chloride: 105 mmol/L (ref 96–106)
Creatinine, Ser: 1.34 mg/dL — ABNORMAL HIGH (ref 0.76–1.27)
Glucose: 116 mg/dL — ABNORMAL HIGH (ref 70–99)
Potassium: 4.6 mmol/L (ref 3.5–5.2)
Sodium: 141 mmol/L (ref 134–144)
eGFR: 57 mL/min/{1.73_m2} — ABNORMAL LOW (ref >59)

## 2024-02-04 MED ORDER — METFORMIN HCL 500 MG PO TABS
500.0000 mg | ORAL_TABLET | Freq: Every day | ORAL | 3 refills | Status: DC
  Filled 2024-02-04: qty 90, 90d supply, fill #0

## 2024-02-04 MED ORDER — LEVOTHYROXINE SODIUM 50 MCG PO TABS
50.0000 ug | ORAL_TABLET | Freq: Every morning | ORAL | 1 refills | Status: DC
  Filled 2024-02-04 – 2024-04-18 (×2): qty 90, 90d supply, fill #0

## 2024-02-04 MED ORDER — TAMSULOSIN HCL 0.4 MG PO CAPS
0.4000 mg | ORAL_CAPSULE | Freq: Every day | ORAL | 1 refills | Status: DC
  Filled 2024-05-03: qty 90, 90d supply, fill #0

## 2024-02-04 MED ORDER — DULOXETINE HCL 60 MG PO CPEP
60.0000 mg | ORAL_CAPSULE | Freq: Every day | ORAL | 1 refills | Status: DC
  Filled 2024-02-04: qty 30, 30d supply, fill #0

## 2024-02-04 MED ORDER — PANTOPRAZOLE SODIUM 40 MG PO TBEC
40.0000 mg | DELAYED_RELEASE_TABLET | Freq: Every day | ORAL | 3 refills | Status: AC
  Filled 2024-02-04: qty 90, 90d supply, fill #0

## 2024-02-04 MED ORDER — DULOXETINE HCL 30 MG PO CPEP
30.0000 mg | ORAL_CAPSULE | Freq: Every day | ORAL | 1 refills | Status: DC
  Filled 2024-02-04: qty 90, 90d supply, fill #0

## 2024-02-04 MED FILL — Rosuvastatin Calcium Tab 40 MG: ORAL | 90 days supply | Qty: 90 | Fill #0 | Status: AC

## 2024-02-05 ENCOUNTER — Other Ambulatory Visit: Payer: Self-pay

## 2024-02-05 ENCOUNTER — Ambulatory Visit (HOSPITAL_COMMUNITY)
Admission: RE | Admit: 2024-02-05 | Discharge: 2024-02-05 | Disposition: A | Discharge status: Home / Self Care | Attending: Internal Medicine | Admitting: Internal Medicine

## 2024-02-05 ENCOUNTER — Encounter (HOSPITAL_COMMUNITY)
Admission: RE | Disposition: A | Discharge status: Home / Self Care | Payer: Self-pay | Source: Home / Self Care | Attending: Internal Medicine

## 2024-02-05 DIAGNOSIS — I2584 Coronary atherosclerosis due to calcified coronary lesion: Secondary | ICD-10-CM | POA: Insufficient documentation

## 2024-02-05 DIAGNOSIS — E785 Hyperlipidemia, unspecified: Secondary | ICD-10-CM | POA: Diagnosis not present

## 2024-02-05 DIAGNOSIS — Z720 Tobacco use: Secondary | ICD-10-CM | POA: Insufficient documentation

## 2024-02-05 DIAGNOSIS — I10 Essential (primary) hypertension: Secondary | ICD-10-CM | POA: Insufficient documentation

## 2024-02-05 DIAGNOSIS — K219 Gastro-esophageal reflux disease without esophagitis: Secondary | ICD-10-CM | POA: Diagnosis not present

## 2024-02-05 DIAGNOSIS — Z79899 Other long term (current) drug therapy: Secondary | ICD-10-CM | POA: Diagnosis not present

## 2024-02-05 DIAGNOSIS — I080 Rheumatic disorders of both mitral and aortic valves: Secondary | ICD-10-CM | POA: Insufficient documentation

## 2024-02-05 DIAGNOSIS — I2510 Atherosclerotic heart disease of native coronary artery without angina pectoris: Secondary | ICD-10-CM | POA: Diagnosis not present

## 2024-02-05 DIAGNOSIS — J449 Chronic obstructive pulmonary disease, unspecified: Secondary | ICD-10-CM | POA: Diagnosis not present

## 2024-02-05 DIAGNOSIS — R7303 Prediabetes: Secondary | ICD-10-CM | POA: Diagnosis not present

## 2024-02-05 HISTORY — PX: LEFT HEART CATH AND CORONARY ANGIOGRAPHY: CATH118249

## 2024-02-05 HISTORY — PX: CORONARY PRESSURE/FFR STUDY: CATH118243

## 2024-02-05 LAB — GLUCOSE, CAPILLARY
Glucose-Capillary: 110 mg/dL — ABNORMAL HIGH (ref 70–99)
Glucose-Capillary: 117 mg/dL — ABNORMAL HIGH (ref 70–99)

## 2024-02-05 LAB — POCT ACTIVATED CLOTTING TIME: Activated Clotting Time: 285 s

## 2024-02-05 SURGERY — LEFT HEART CATH AND CORONARY ANGIOGRAPHY
Anesthesia: LOCAL

## 2024-02-05 MED ORDER — VERAPAMIL HCL 2.5 MG/ML IV SOLN
INTRAVENOUS | Status: DC | PRN
  Administered 2024-02-05: 10 mL via INTRA_ARTERIAL

## 2024-02-05 MED ORDER — MIDAZOLAM HCL 2 MG/2ML IJ SOLN
INTRAMUSCULAR | Status: DC | PRN
  Administered 2024-02-05: 1 mg via INTRAVENOUS

## 2024-02-05 MED ORDER — MIDAZOLAM HCL 2 MG/2ML IJ SOLN
INTRAMUSCULAR | Status: AC
  Filled 2024-02-05: qty 2

## 2024-02-05 MED ORDER — HEPARIN (PORCINE) IN NACL 2000-0.9 UNIT/L-% IV SOLN
INTRAVENOUS | Status: DC | PRN
  Administered 2024-02-05: 1000 mL

## 2024-02-05 MED ORDER — HEPARIN SODIUM (PORCINE) 1000 UNIT/ML IJ SOLN
INTRAMUSCULAR | Status: DC | PRN
  Administered 2024-02-05 (×2): 5000 [IU] via INTRAVENOUS

## 2024-02-05 MED ORDER — FENTANYL CITRATE (PF) 100 MCG/2ML IJ SOLN
INTRAMUSCULAR | Status: DC | PRN
  Administered 2024-02-05: 25 ug via INTRAVENOUS

## 2024-02-05 MED ORDER — HYDRALAZINE HCL 20 MG/ML IJ SOLN: 10.0000 mg | INTRAMUSCULAR | Status: DC | PRN

## 2024-02-05 MED ORDER — SODIUM CHLORIDE 0.9 % IV SOLN: 250.0000 mL | INTRAVENOUS | Status: DC | PRN

## 2024-02-05 MED ORDER — FENTANYL CITRATE (PF) 100 MCG/2ML IJ SOLN
INTRAMUSCULAR | Status: AC
  Filled 2024-02-05: qty 2

## 2024-02-05 MED ORDER — SODIUM CHLORIDE 0.9% FLUSH: 3.0000 mL | Freq: Two times a day (BID) | INTRAVENOUS | Status: DC

## 2024-02-05 MED ORDER — HEPARIN SODIUM (PORCINE) 1000 UNIT/ML IJ SOLN
INTRAMUSCULAR | Status: AC
  Filled 2024-02-05: qty 10

## 2024-02-05 MED ORDER — ASPIRIN 81 MG PO CHEW: 81.0000 mg | CHEWABLE_TABLET | ORAL | Status: DC

## 2024-02-05 MED ORDER — SODIUM CHLORIDE 0.9% FLUSH: 3.0000 mL | INTRAVENOUS | Status: DC | PRN

## 2024-02-05 MED ORDER — LIDOCAINE HCL (PF) 1 % IJ SOLN
INTRAMUSCULAR | Status: DC | PRN
  Administered 2024-02-05: 2 mL via INTRADERMAL

## 2024-02-05 MED ORDER — IOHEXOL 350 MG/ML SOLN
INTRAVENOUS | Status: DC | PRN
  Administered 2024-02-05: 42 mL

## 2024-02-05 MED ORDER — ACETAMINOPHEN 325 MG PO TABS: 650.0000 mg | ORAL_TABLET | ORAL | Status: DC | PRN

## 2024-02-05 MED ORDER — ONDANSETRON HCL 4 MG/2ML IJ SOLN: 4.0000 mg | Freq: Four times a day (QID) | INTRAMUSCULAR | Status: DC | PRN

## 2024-02-05 MED ORDER — LABETALOL HCL 5 MG/ML IV SOLN: 10.0000 mg | INTRAVENOUS | Status: DC | PRN

## 2024-02-05 MED ORDER — VERAPAMIL HCL 2.5 MG/ML IV SOLN
INTRAVENOUS | Status: AC
  Filled 2024-02-05: qty 2

## 2024-02-05 MED ORDER — NITROGLYCERIN 1 MG/10 ML FOR IR/CATH LAB
INTRA_ARTERIAL | Status: DC | PRN
  Administered 2024-02-05: 100 ug via INTRACORONARY

## 2024-02-05 MED ORDER — SODIUM CHLORIDE 0.9 % WEIGHT BASED INFUSION: 3.0000 mL/kg/h | INTRAVENOUS | Status: AC

## 2024-02-05 MED ORDER — SODIUM CHLORIDE 0.9 % WEIGHT BASED INFUSION: 1.0000 mL/kg/h | INTRAVENOUS | Status: DC

## 2024-02-05 MED ORDER — NITROGLYCERIN 1 MG/10 ML FOR IR/CATH LAB
INTRA_ARTERIAL | Status: AC
  Filled 2024-02-05: qty 10

## 2024-02-05 MED ORDER — LIDOCAINE HCL (PF) 1 % IJ SOLN
INTRAMUSCULAR | Status: AC
  Filled 2024-02-05: qty 30

## 2024-02-05 SURGICAL SUPPLY — 11 items
CATH 5FR JL3.5 JR4 ANG PIG MP (CATHETERS) IMPLANT
CATH INFINITI 5FR AL1 (CATHETERS) IMPLANT
CATH LAUNCHER 5F EBU3.5 (CATHETERS) IMPLANT
DEVICE RAD COMP TR BAND LRG (VASCULAR PRODUCTS) IMPLANT
GLIDESHEATH SLEND SS 6F .021 (SHEATH) IMPLANT
GUIDEWIRE INQWIRE 1.5J.035X260 (WIRE) IMPLANT
GUIDEWIRE PRESSURE X 175 (WIRE) IMPLANT
KIT ESSENTIALS PG (KITS) IMPLANT
PACK CARDIAC CATHETERIZATION (CUSTOM PROCEDURE TRAY) ×2 IMPLANT
SET ATX-X65L (MISCELLANEOUS) IMPLANT
WIRE EMERALD ST .035X260CM (WIRE) IMPLANT

## 2024-02-05 NOTE — Brief Op Note (Signed)
 BRIEF CARDIAC CATHETERIZATION NOTE  02/05/2024  10:25 AM  PATIENT:  Justin Waters  73 y.o. male  PRE-OPERATIVE DIAGNOSIS:  Coronary artery disease and dyspnea on exertion  POST-OPERATIVE DIAGNOSIS:  Nonobstructive CAD  PROCEDURE:  Procedure(s): LEFT HEART CATH AND CORONARY ANGIOGRAPHY (N/A) CORONARY PRESSURE/FFR STUDY (N/A)  SURGEON:  Surgeons and Role:    Sammy Crisp, MD - Primary  FINDINGS:  Moderate multivessel CAD.  Most significant stenosis is 40-50% distal LMCA lesion that is not hemodynamically significant (RFR = 0.94). Mildly elevated LVEDP (LVEDP 21 mmHg). Mild aortic valve stenosis (mea gradient 14 mmHg).  RECOMMENDATIONS: Proceed with echo. Medical therapy and risk factor modification to prevent progression of coronary artery disease.  Sammy Crisp, MD [facility]

## 2024-02-05 NOTE — Interval H&P Note (Signed)
 History and Physical Interval Note:  02/05/2024 8:48 AM  Justin Waters  has presented today for surgery, with the diagnosis of CAD,DOE.  The various methods of treatment have been discussed with the patient and family. After consideration of risks, benefits and other options for treatment, the patient has consented to  Procedure(s): LEFT HEART CATH AND CORONARY ANGIOGRAPHY (N/A) as a surgical intervention.  The patient's history has been reviewed, patient examined, no change in status, stable for surgery.  I have reviewed the patient's chart and labs.  Questions were answered to the patient's satisfaction.    Cath Lab Visit (complete for each Cath Lab visit)  Clinical Evaluation Leading to the Procedure:   ACS: No.  Non-ACS:    Anginal Classification: CCS III (?DOE as anginal equivalent)  Anti-ischemic medical therapy: Minimal Therapy (1 class of medications)  Non-Invasive Test Results: No non-invasive testing performed  Prior CABG: No previous CABG  Justin Waters

## 2024-02-06 ENCOUNTER — Encounter (HOSPITAL_COMMUNITY): Payer: Self-pay | Admitting: Internal Medicine

## 2024-02-07 ENCOUNTER — Other Ambulatory Visit (HOSPITAL_BASED_OUTPATIENT_CLINIC_OR_DEPARTMENT_OTHER): Payer: Self-pay

## 2024-02-17 DIAGNOSIS — G4733 Obstructive sleep apnea (adult) (pediatric): Secondary | ICD-10-CM | POA: Diagnosis not present

## 2024-02-17 DIAGNOSIS — J449 Chronic obstructive pulmonary disease, unspecified: Secondary | ICD-10-CM | POA: Diagnosis not present

## 2024-02-25 DIAGNOSIS — G4733 Obstructive sleep apnea (adult) (pediatric): Secondary | ICD-10-CM | POA: Diagnosis not present

## 2024-02-26 ENCOUNTER — Ambulatory Visit (HOSPITAL_BASED_OUTPATIENT_CLINIC_OR_DEPARTMENT_OTHER): Payer: Self-pay | Admitting: Family

## 2024-02-26 ENCOUNTER — Ambulatory Visit (INDEPENDENT_AMBULATORY_CARE_PROVIDER_SITE_OTHER)

## 2024-02-26 DIAGNOSIS — E785 Hyperlipidemia, unspecified: Secondary | ICD-10-CM

## 2024-02-26 DIAGNOSIS — I10 Essential (primary) hypertension: Secondary | ICD-10-CM

## 2024-02-26 DIAGNOSIS — J439 Emphysema, unspecified: Secondary | ICD-10-CM

## 2024-02-26 DIAGNOSIS — I25118 Atherosclerotic heart disease of native coronary artery with other forms of angina pectoris: Secondary | ICD-10-CM

## 2024-02-26 DIAGNOSIS — R0609 Other forms of dyspnea: Secondary | ICD-10-CM | POA: Diagnosis not present

## 2024-02-26 DIAGNOSIS — I998 Other disorder of circulatory system: Secondary | ICD-10-CM

## 2024-02-26 DIAGNOSIS — I6523 Occlusion and stenosis of bilateral carotid arteries: Secondary | ICD-10-CM

## 2024-02-26 LAB — ECHOCARDIOGRAM COMPLETE
AR max vel: 1.88 cm2
AV Area VTI: 2.13 cm2
AV Area mean vel: 1.76 cm2
AV Mean grad: 9 mmHg
AV Peak grad: 15.2 mmHg
Ao pk vel: 1.95 m/s
Area-P 1/2: 3.48 cm2
MV VTI: 1.47 cm2
P 1/2 time: 1264 ms
S' Lateral: 2.57 cm

## 2024-02-27 ENCOUNTER — Telehealth (HOSPITAL_BASED_OUTPATIENT_CLINIC_OR_DEPARTMENT_OTHER): Payer: Self-pay | Admitting: Family

## 2024-02-27 ENCOUNTER — Other Ambulatory Visit (HOSPITAL_BASED_OUTPATIENT_CLINIC_OR_DEPARTMENT_OTHER): Payer: Self-pay

## 2024-02-27 MED ORDER — ROSUVASTATIN CALCIUM 40 MG PO TABS
40.0000 mg | ORAL_TABLET | Freq: Every day | ORAL | 3 refills | Status: AC
  Filled 2024-02-27 – 2024-06-23 (×2): qty 90, 90d supply, fill #0
  Filled 2024-09-20: qty 90, 90d supply, fill #1

## 2024-02-27 MED ORDER — ASPIRIN 81 MG PO TBEC
81.0000 mg | DELAYED_RELEASE_TABLET | Freq: Every day | ORAL | 11 refills | Status: AC
  Filled 2024-02-27: qty 30, 30d supply, fill #0
  Filled 2024-04-18: qty 30, 30d supply, fill #1
  Filled 2024-06-23: qty 30, 30d supply, fill #2
  Filled 2024-07-23: qty 30, 30d supply, fill #3
  Filled 2024-09-20: qty 30, 30d supply, fill #4
  Filled 2024-10-19: qty 30, 30d supply, fill #5

## 2024-02-27 MED ORDER — IRBESARTAN 150 MG PO TABS
150.0000 mg | ORAL_TABLET | Freq: Every day | ORAL | 3 refills | Status: DC
  Filled 2024-02-27 – 2024-03-01 (×2): qty 90, 90d supply, fill #0
  Filled 2024-04-18 – 2024-06-21 (×2): qty 90, 90d supply, fill #1

## 2024-02-27 MED ORDER — EZETIMIBE 10 MG PO TABS
10.0000 mg | ORAL_TABLET | Freq: Every day | ORAL | 3 refills | Status: AC
  Filled 2024-02-27: qty 90, 90d supply, fill #0
  Filled 2024-06-23: qty 90, 90d supply, fill #1

## 2024-02-27 MED ORDER — CARVEDILOL 6.25 MG PO TABS
6.2500 mg | ORAL_TABLET | Freq: Two times a day (BID) | ORAL | 3 refills | Status: AC
  Filled 2024-02-27 – 2024-04-18 (×2): qty 90, 45d supply, fill #0

## 2024-02-27 MED ORDER — DAPAGLIFLOZIN PROPANEDIOL 10 MG PO TABS
10.0000 mg | ORAL_TABLET | Freq: Every day | ORAL | 0 refills | Status: DC
Start: 2024-02-27 — End: 2024-02-27

## 2024-02-27 MED ORDER — DAPAGLIFLOZIN PROPANEDIOL 10 MG PO TABS
10.0000 mg | ORAL_TABLET | Freq: Every day | ORAL | 3 refills | Status: AC
Start: 2024-02-27 — End: 2025-02-21
  Filled 2024-02-27: qty 90, 90d supply, fill #0

## 2024-02-27 MED ORDER — NITROGLYCERIN 0.4 MG SL SUBL
0.4000 mg | SUBLINGUAL_TABLET | SUBLINGUAL | 4 refills | Status: AC | PRN
  Filled 2024-02-27: qty 25, 8d supply, fill #0

## 2024-02-27 NOTE — Telephone Encounter (Signed)
 Returned call to patient, cardiac prescriptions transferred,patient is appreciative of the call.

## 2024-02-27 NOTE — Telephone Encounter (Signed)
 Pt stopped by to pick up samples. He stated that he wanted to change his pharmacy to ours at the the [facility]. Also inquired if he was going to be prescribed what he was given as samples. Please call and advise.

## 2024-03-01 ENCOUNTER — Other Ambulatory Visit (HOSPITAL_BASED_OUTPATIENT_CLINIC_OR_DEPARTMENT_OTHER): Payer: Self-pay

## 2024-03-05 ENCOUNTER — Other Ambulatory Visit (HOSPITAL_BASED_OUTPATIENT_CLINIC_OR_DEPARTMENT_OTHER): Payer: Self-pay

## 2024-03-15 DIAGNOSIS — K080 Exfoliation of teeth due to systemic causes: Secondary | ICD-10-CM | POA: Diagnosis not present

## 2024-03-16 DIAGNOSIS — E119 Type 2 diabetes mellitus without complications: Secondary | ICD-10-CM | POA: Diagnosis not present

## 2024-03-16 NOTE — Progress Notes (Unsigned)
 Cardiology Office Note    Date:  03/17/2024  ID:  MERRIL NAGY, DOB 08-23-1951, MRN [id number] PCP:  Helyn Lobstein, MD  Cardiologist:  Euell Herrlich, MD  Electrophysiologist:  None   Chief Complaint: Follow up for CAD   History of Present Illness: .    DEON DUER is a 73 y.o. male with visit-pertinent history of CAD, hypertension, hyperlipidemia, prediabetes, COPD, tobacco use and GERD.  Coronary CTA in 08/2020 with a calcium  score of 2111 placing the patient in the 95th percentile with plaque in LM, LAD, LCx, RCA though FFR hemodynamically significant only at distal LAD, likely due to diffuse disease and small caliber distal LAD.  Echo at that time indicated LVEF 65 to 70%, no RWMA, mild LVH, grade 1 DD, moderate MR, moderate to severe mitral annular calcification, mild aortic valve sclerosis without stenosis.  Patient was seen on 02/07/2023 by Dr. Chancy Comber, his LDL remained above goal and was recommended to start Zetia  10 mg daily.  In 08/2023 he presented to the ED with chest pain that was worse with inspiration.  Workup was unremarkable with exception of CT angio chest/abdomen/pelvis with inadvertent finding of stable scattered tiny sub-5 mm bilateral pulmonary nodules recommended for routine monitoring.  EKG indicated normal sinus rhythm at 79 bpm with no acute ST/T wave changes.  Patient was seen in clinic on 01/30/2024 due to concerns about dyspnea on exertion.  He was noted that he had exertional dyspnea that been present for 6 months and have become noticeable with minimal activity such as walking to the car or moving around inside his house.  It was noted he had a history of COPD but had never been on inhaler.  It was also noted that patient had different blood pressures between arms with his left arm reading lower than his right arm, carotid duplex was ordered.  It was recommended that patient undergo cardiac catheterization.  LHC on 02/05/2024 indicated moderate multivessel  coronary artery disease, most significant stenosis was 40 to 50% distal LMCA lesion that was not hemodynamically significant, RFR 0.94, mildly elevated LVEDP, mild aortic valve stenosis with mean gradient 14 mmHg.  Echocardiogram on 02/26/2024 indicated LVEF of 60 to 65%, no RWMA, mild concentric LVH, grade 2 diastolic dysfunction, elevated LVEDP, RV systolic function and size was normal, mildly elevated PASP, LA was moderately dilated, RA was mildly dilated mild mitral stenosis with mild mitral valve regurgitation, aortic valve sclerosis was present without evidence of stenosis, mild aortic valve regurgitation.  Farxiga  10 mg daily was added.  Carotid duplex on 02/26/2024 indicated right ICA consistent with 40 to 59% stenosis, left ICA consistent with 40 to 59% stenosis with normal hemodynamics and bilateral subclavian arteries.Patient reports that he had no improvement in breathing with Farxiga .   Today he presents for, he reports that he is doing well overall.  He denies any chest pain, continues to note intermittent dyspnea on exertion.  He reports that some days will be present and other days not as prominent at.  He denies any lower extremity edema, orthopnea or PND.  He did trial Farxiga  however felt that he did not have any significant benefit and was cost prohibitive.  He feels that his shortness of breath is likely related to his rate of COPD, plans to follow-up with his PCP regarding  He denies palpitations, presyncope or syncope.  ROS: .   Today he denies chest pain, lower extremity edema, fatigue, palpitations, melena, hematuria, hemoptysis, diaphoresis, weakness, presyncope, syncope,  orthopnea, and PND.  All other systems are reviewed and otherwise negative. Studies Reviewed: Aaron Aas   EKG:  EKG is not ordered today.  CV Studies: Cardiac studies reviewed are outlined and summarized above. Otherwise please see EMR for full report. Cardiac Studies & Procedures    ______________________________________________________________________________________________ CARDIAC CATHETERIZATION  CARDIAC CATHETERIZATION 02/05/2024  Conclusion Conclusions: Moderate multivessel coronary artery disease, as detailed below.  Most significant stenosis is 40-50% distal LMCA lesion that is not hemodynamically significant (RFR = 0.94). Mildly elevated left ventricular filling pressure (LVEDP 21 mmHg). Mild aortic valve stenosis (mean gradient 14 mmHg).  Recommendations: Proceed with echo as scheduled. Medical therapy and risk factor modification to prevent progression of coronary artery disease.  Sammy Crisp, MD [facility]  Findings Coronary Findings Diagnostic  Dominance: Right  Left Main Vessel is large. Mid LM to Dist LM lesion is 45% stenosed. The lesion is focal and eccentric. The lesion is moderately calcified. Pressure gradient was performed on the lesion. RFR: 0.94.  Left Anterior Descending Vessel is large. Prox LAD lesion is 30% stenosed. The lesion is eccentric.  First Diagonal Branch Vessel is moderate in size.  Ramus Intermedius Vessel is small.  Left Circumflex Ost Cx lesion is 30% stenosed. Prox Cx lesion is 30% stenosed.  First Obtuse Marginal Branch Vessel is small in size.  Second Obtuse Marginal Branch Vessel is large in size.  Third Obtuse Marginal Branch Vessel is small in size.  Right Coronary Artery Vessel is moderate in size. Prox RCA lesion is 20% stenosed. The lesion is moderately calcified.  Right Posterior Descending Artery Vessel is moderate in size.  Right Posterior Atrioventricular Artery Vessel is moderate in size.  First Right Posterolateral Branch Vessel is small in size.  Second Right Posterolateral Branch Vessel is small in size.  Intervention  No interventions have been documented.     ECHOCARDIOGRAM  ECHOCARDIOGRAM COMPLETE 02/26/2024  Narrative ECHOCARDIOGRAM  REPORT    Patient Name:   MONG NEAL [facility] Date of Exam: 02/26/2024 Medical Rec #:  [id number]      Height:       72.0 in Accession #:    [id number]     Weight:       212.0 lb Date of Birth:  [birth year]     BSA:          2.184 m Patient Age:    72 years       BP:           110/70 mmHg Patient Gender: M              HR:           67 bpm. Exam Location:  Outpatient  Procedure: 3D Echo, 2D Echo, Cardiac Doppler, Color Doppler and Strain Analysis (Both Spectral and Color Flow Doppler were utilized during procedure).  Indications:    Dyspnea  History:        Patient has prior history of Echocardiogram examinations, most recent 10/29/2021. CAD, COPD, Signs/Symptoms:Chest Pain; Risk Factors:Hypertension, Dyslipidemia and Former Smoker.  Sonographer:    Gelene Kelly RDCS Referring Phys: [id number] CAITLIN S WALKER  IMPRESSIONS   1. Left ventricular ejection fraction, by estimation, is 60 to 65%. Left ventricular ejection fraction by 3D volume is 64 %. The left ventricle has normal function. The left ventricle has no regional wall motion abnormalities. There is mild concentric left ventricular hypertrophy. Left ventricular diastolic parameters are consistent with Grade II diastolic dysfunction (pseudonormalization). Elevated left ventricular end-diastolic pressure. The average left  ventricular global longitudinal strain is -18.8 %. The global longitudinal strain is normal. 2. Right ventricular systolic function is normal. The right ventricular size is normal. There is mildly elevated pulmonary artery systolic pressure. 3. Left atrial size was moderately dilated. 4. Right atrial size was mildly dilated. 5. 3D images of the mitral valve were recorded revealing thickened mitral valve leaflets. The mitral valve is degenerative. Mild mitral valve regurgitation. Mild mitral stenosis. The mean mitral valve gradient is 4.0 mmHg. Severe mitral annular calcification. 6. The aortic valve is tricuspid.  There is moderate calcification of the aortic valve. There is moderate thickening of the aortic valve. Aortic valve regurgitation is mild. Aortic valve sclerosis/calcification is present, without any evidence of aortic stenosis. Aortic valve area, by VTI measures 2.13 cm. Aortic valve mean gradient measures 9.0 mmHg. Aortic valve Vmax measures 1.95 m/s. 7. The inferior vena cava is normal in size with greater than 50% respiratory variability, suggesting right atrial pressure of 3 mmHg.  FINDINGS Left Ventricle: Left ventricular ejection fraction, by estimation, is 60 to 65%. Left ventricular ejection fraction by 3D volume is 64 %. The left ventricle has normal function. The left ventricle has no regional wall motion abnormalities. The average left ventricular global longitudinal strain is -18.8 %. Strain was performed and the global longitudinal strain is normal. The left ventricular internal cavity size was normal in size. There is mild concentric left ventricular hypertrophy. Left ventricular diastolic parameters are consistent with Grade II diastolic dysfunction (pseudonormalization). Elevated left ventricular end-diastolic pressure.  Right Ventricle: The right ventricular size is normal. No increase in right ventricular wall thickness. Right ventricular systolic function is normal. There is mildly elevated pulmonary artery systolic pressure. The tricuspid regurgitant velocity is 2.89 m/s, and with an assumed right atrial pressure of 3 mmHg, the estimated right ventricular systolic pressure is 36.4 mmHg.  Left Atrium: Left atrial size was moderately dilated.  Right Atrium: Right atrial size was mildly dilated.  Pericardium: There is no evidence of pericardial effusion.  Mitral Valve: 3D images of the mitral valve were recorded revealing thickened mitral valve leaflets. The mitral valve is degenerative in appearance. Severe mitral annular calcification. Mild mitral valve regurgitation. Mild  mitral valve stenosis. MV peak gradient, 8.5 mmHg. The mean mitral valve gradient is 4.0 mmHg with average heart rate of 61 bpm.  Tricuspid Valve: The tricuspid valve is normal in structure. Tricuspid valve regurgitation is trivial. No evidence of tricuspid stenosis.  Aortic Valve: The aortic valve is tricuspid. There is moderate calcification of the aortic valve. There is moderate thickening of the aortic valve. Aortic valve regurgitation is mild. Aortic regurgitation PHT measures 1264 msec. Aortic valve sclerosis/calcification is present, without any evidence of aortic stenosis. Aortic valve mean gradient measures 9.0 mmHg. Aortic valve peak gradient measures 15.2 mmHg. Aortic valve area, by VTI measures 2.13 cm.  Pulmonic Valve: The pulmonic valve was normal in structure. Pulmonic valve regurgitation is not visualized. No evidence of pulmonic stenosis.  Aorta: The aortic root is normal in size and structure.  Venous: The inferior vena cava is normal in size with greater than 50% respiratory variability, suggesting right atrial pressure of 3 mmHg.  IAS/Shunts: No atrial level shunt detected by color flow Doppler.  Additional Comments: 3D was performed not requiring image post processing on an independent workstation and was normal.   LEFT VENTRICLE PLAX 2D LVIDd:         4.22 cm         Diastology LVIDs:  2.57 cm         LV e' medial:    6.74 cm/s LV PW:         1.09 cm         LV E/e' medial:  23.6 LV IVS:        1.07 cm         LV e' lateral:   6.31 cm/s LVOT diam:     2.00 cm         LV E/e' lateral: 25.2 LV SV:         82 LV SV Index:   38              2D Longitudinal LVOT Area:     3.14 cm        Strain 2D Strain GLS   -20.2 % (A4C): 2D Strain GLS   -17.1 % (A3C): 2D Strain GLS   -19.2 % (A2C): 2D Strain GLS   -18.8 % Avg:  3D Volume EF LV 3D EF:    Left ventricul ar ejection fraction by 3D volume is 64 %.  3D Volume EF: 3D EF:        64 % LV EDV:        134 ml LV ESV:       48 ml LV SV:        86 ml  RIGHT VENTRICLE RV Basal diam:  5.29 cm RV Mid diam:    3.23 cm RV S prime:     12.80 cm/s TAPSE (M-mode): 2.2 cm  LEFT ATRIUM             Index        RIGHT ATRIUM           Index LA diam:        4.50 cm 2.06 cm/m   RA Area:     22.40 cm LA Vol (A2C):   99.0 ml 45.33 ml/m  RA Volume:   69.10 ml  31.64 ml/m LA Vol (A4C):   51.8 ml 23.72 ml/m LA Biplane Vol: 73.3 ml 33.56 ml/m AORTIC VALVE AV Area (Vmax):    1.88 cm AV Area (Vmean):   1.76 cm AV Area (VTI):     2.13 cm AV Vmax:           195.00 cm/s AV Vmean:          138.000 cm/s AV VTI:            0.387 m AV Peak Grad:      15.2 mmHg AV Mean Grad:      9.0 mmHg LVOT Vmax:         117.00 cm/s LVOT Vmean:        77.100 cm/s LVOT VTI:          0.262 m LVOT/AV VTI ratio: 0.68 AI PHT:            1264 msec  AORTA Ao Root diam: 3.60 cm Ao Asc diam:  3.40 cm  MITRAL VALVE                TRICUSPID VALVE MV Area (PHT): 3.48 cm     TR Peak grad:   33.4 mmHg MV Area VTI:   1.47 cm     TR Vmax:        289.00 cm/s MV Peak grad:  8.5 mmHg MV Mean grad:  4.0 mmHg     SHUNTS MV Vmax:  1.46 m/s     Systemic VTI:  0.26 m MV Vmean:      90.1 cm/s    Systemic Diam: 2.00 cm MV Decel Time: 218 msec MV E velocity: 159.00 cm/s MV A velocity: 146.00 cm/s MV E/A ratio:  1.09  Maudine Sos MD Electronically signed by Maudine Sos MD Signature Date/Time: 02/26/2024/12:24:45 PM    Final      CT SCANS  CT CORONARY FRACTIONAL FLOW RESERVE DATA PREP 09/19/2020  Narrative EXAM: CT FFR ANALYSIS  CLINICAL DATA:  Chest pain, nonspecific  FINDINGS: FFRct analysis was performed on the original cardiac CT angiogram dataset. Diagrammatic representation of the FFRct analysis is provided in a separate PDF document in PACS. This dictation was created using the PDF document and an interactive 3D model of the results. 3D model is not available in the EMR/PACS. Normal  FFR range is >0.80. Indeterminate (grey) zone is 0.76-0.80.  1. Left Main: FFR = 0.97  2. LAD: Proximal FFR = 0.87, Mid FFR = 0.82, Distal FFR = 0.72 3. LCX: Proximal FFR = 0.91, Distal FFR = 0.86 4. RCA: Proximal FFR = 0.97, Mid FFR =0.95, Distal FFR = 0.88  IMPRESSION: 1. CT FFR analysis showed a hemodynamically significant FFR value in the distal LAD. This is likely due to diffuse disease in the small caliber distal LAD.  RECOMMENDATIONS: Guideline-directed medical therapy and aggressive risk factor modification for secondary prevention of coronary artery disease.   Electronically Signed By: Grady Lawman On: 09/20/2020 10:31   CT CORONARY MORPH W/CTA COR W/SCORE 09/19/2020  Addendum 09/20/2020 10:23 AM ADDENDUM REPORT: 09/20/2020 10:21  HISTORY: Chest pain, nonspecific  EXAM: Cardiac/Coronary  CT  TECHNIQUE: The patient was scanned on a [employer].  PROTOCOL: A 120 kV prospective scan was triggered in the descending thoracic aorta at 111 HU's. Axial non-contrast 3 mm slices were carried out through the heart. The data set was analyzed on a dedicated work station and scored using the Agatston method. Gantry rotation speed was 250 msecs and collimation was .6 mm. Beta blockade and 0.8 mg of sl NTG was given. The 3D data set was reconstructed in 5% intervals of the 67-82 % of the R-R cycle. Diastolic phases were analyzed on a dedicated work station using MPR, MIP and VRT modes. The patient received 80mL OMNIPAQUE  IOHEXOL  350 MG/ML SOLN of contrast.  FINDINGS: Image quality: good  Noise artifact is: Moderate, calcium  blooming artifact.  Coronary calcium  score is 2111, which places the patient in the 95th percentile for age and sex matched control.  Coronary arteries: Normal coronary origins.  Right dominance.  Right Coronary Artery: Mild mixed atherosclerotic plaque in the proximal RCA, 25-49% stenosis. Serial moderate mixed  atherosclerotic stenoses in the mid RCA, 50-69% stenosis. Mild mixed atherosclerotic plaque in the distal RCA, 25-49% stenosis.  Left Main Coronary Artery: Minimal ostial left main mixed atherosclerotic plaque, <25% stenosis. Mild mixed atherosclerotic plaque in the distal left main coronary artery, 25-49% stenosis.  Left Anterior Descending Coronary Artery: Wrap around LAD. Moderate proximal, mid, and distal LAD mixed atherosclerotic plaque, 50-69% stenosis. The portion of the distal LAD that wraps around the LV apex is diffusely diseased. Moderate mixed atherosclerotic plaque in the first diagonal artery, 50-69% stenosis.  Left Circumflex Artery: Mild mixed atherosclerotic plaque in the proximal L circumflex artery, 25-49% stenosis. After the second OM, the distal L circumflex as two serial moderate mixed atherosclerotic plaques, 50-69% stenosis.  Aorta: Normal size, 36 mm at the mid ascending  aorta (level of the PA bifurcation) measured double oblique. Mild calcifications. No dissection.  Aortic Valve: Moderate calcifications.  AV calcium  score 748.  Other findings:  Normal pulmonary vein drainage into the left atrium.  Normal left atrial appendage without a thrombus.  Normal size of the pulmonary artery.  Mild mitral annular calcifications.  IMPRESSION: 1. Moderate CAD, CADRADS = 3. CT FFR will be performed and reported separately.  2. Coronary calcium  score is 2111, which places the patient in the 95th percentile for age and sex matched control.  3. Normal coronary origin with right dominance.   Electronically Signed By: Grady Lawman On: 09/20/2020 10:21  Narrative EXAM: OVER-READ INTERPRETATION  CT CHEST  The following report is an over-read performed by radiologist Dr. Zara Heymann of [facility] on 09/19/2020. This over-read does not include interpretation of cardiac or coronary anatomy or pathology. The coronary calcium  score/coronary  CTA interpretation by the cardiologist is attached.  COMPARISON:  None  FINDINGS: Vascular: Calcified atheromatous plaque of the thoracic aorta and coronary artery calcifications, see dedicated CT angiography report and coronary calcium  scoring.  Mediastinum/Nodes: Limited visualization of mediastinal structures without signs of adenopathy. Visualized esophagus is grossly normal. No hilar adenopathy within the visualized portions of the hila bilaterally.  Lungs/Pleura: Moderate pulmonary emphysema. No consolidation. No pleural effusion. Lungs are incompletely imaged.  Small RIGHT upper lobe pulmonary nodule (image 17, series 12) 3 mm.  Small RIGHT middle lobe pulmonary nodule in the RIGHT costodiaphragmatic recess 4 mm (image 33, series 12)  Upper Abdomen: No acute upper abdominal process.  Musculoskeletal: No acute bone finding or destructive bone process. Spinal degenerative changes.  IMPRESSION: 1. Moderate pulmonary emphysema. 2. Small pulmonary nodules in the RIGHT chest as described largest 4 mm. Non-contrast chest CT can be considered in 12 months in this high risk patient. This recommendation follows the consensus statement: Guidelines for Management of Incidental Pulmonary Nodules Detected on CT Images: From the Fleischner Society 2017; Radiology 2017; 284:228-243. Is 3. Calcified atheromatous plaque of the thoracic aorta and coronary artery calcifications, see dedicated CT angiography report and coronary calcium  scoring.  Aortic Atherosclerosis (ICD10-I70.0) and Emphysema (ICD10-J43.9).  Electronically Signed: By: Zara Heymann M.D. On: 09/19/2020 09:45     ______________________________________________________________________________________________       Current Reported Medications:.    Current Meds  Medication Sig   aspirin  EC 81 MG tablet Take 1 tablet (81 mg total) by mouth daily. Swallow whole.   carvedilol  (COREG ) 6.25 MG tablet Take 1 tablet  (6.25 mg total) by mouth 2 (two) times daily.   Cholecalciferol (VITAMIN D3 PO) Take 1 tablet by mouth daily.   cyanocobalamin (VITAMIN B12) 1000 MCG tablet Take 1,000 mcg by mouth daily.   ezetimibe  (ZETIA ) 10 MG tablet Take 1 tablet (10 mg total) by mouth daily.   furosemide (LASIX) 20 MG tablet Take 1 tablet by mouth once a day for the next 3 days, Then take as needed for any lower extremity swelling, shortness of breath, or a weight gain of 3 lb overnight or 5 lbs in a week.   irbesartan  (AVAPRO ) 150 MG tablet Take 1 tablet (150 mg total) by mouth daily.   levothyroxine  (SYNTHROID ) 50 MCG tablet Take 1 tablet (50 mcg total) by mouth every morning on an empty stomach   metFORMIN  (GLUCOPHAGE ) 500 MG tablet Take 1 tablet (500 mg total) by mouth 2 (two) times daily with a meal.   Multiple Vitamin (MULTIVITAMIN) capsule Take 1 capsule by mouth daily.  nitroGLYCERIN  (NITROSTAT ) 0.4 MG SL tablet Place 1 tablet (0.4 mg total) under the tongue every 5 (five) minutes as needed for chest pain.   NON FORMULARY Pt uses a cpap nightly   omeprazole (PRILOSEC) 20 MG capsule Take 20 mg by mouth daily.   pantoprazole  (PROTONIX ) 40 MG tablet Take 1 tablet (40 mg total) by mouth daily.   Probiotic Product (PROBIOTIC-10 PO) Take 1 tablet by mouth daily.   rosuvastatin  (CRESTOR ) 40 MG tablet Take 1 tablet (40 mg total) by mouth daily.   tamsulosin  (FLOMAX ) 0.4 MG CAPS capsule Take 0.4 mg by mouth daily.   [DISCONTINUED] ivabradine  (CORLANOR) 5 MG TABS tablet Take 2 tablets 2 hours prior to Cardiac CT    Physical Exam:    VS:  BP 128/62   Pulse 65   Ht 6' (1.829 m)   Wt 214 lb 9.6 oz (97.3 kg)   SpO2 95%   BMI 29.10 kg/m    Wt Readings from Last 3 Encounters:  03/17/24 214 lb 9.6 oz (97.3 kg)  02/05/24 212 lb (96.2 kg)  01/30/24 213 lb 3.2 oz (96.7 kg)    GEN: Well nourished, well developed in no acute distress NECK: No JVD; No carotid bruits CARDIAC: RRR, no murmurs, rubs, gallops, right radial  cath site clean and intact without evidence of hematoma.  RESPIRATORY:  Clear to auscultation without rales, wheezing or rhonchi  ABDOMEN: Soft, non-tender, non-distended EXTREMITIES:  No edema; No acute deformity     Asessement and Plan:.    CAD:  LHC on 02/05/2024 indicated moderate multivessel coronary artery disease, most significant stenosis was 40 to 50% distal LMCA lesion that was not hemodynamically significant, RFR 0.94, mildly elevated LVEDP, mild aortic valve stenosis with mean gradient 14 mmHg.  Today he reports that he is doing well, denies any chest pain, continues to note intermittent dyspnea on exertion.  No significant change with initiation of Farxiga , patient is discontinued as it is cost prohibitive, please see below. Heart healthy diet and regular cardiovascular exercise encouraged.  Reviewed ED precautions.  Continue aspirin  81 mg daily, carvedilol  6.25 mg twice daily, Zetia  10 mg daily, irbesartan  150 mg daily, Crestor  40 mg daily.  Carotid artery stenosis: Carotid duplex on 02/26/2024 indicated right ICA consistent with 40 to 59% stenosis, left ICA consistent with 40 to 59% stenosis with normal hemodynamics and bilateral subclavian arteries.  Repeat carotids ordered for next year.  Continue aspirin  81 mg daily, Zetia  10 mg daily and rosuvastatin  40 mg daily.  Diastolic dysfunction: Echocardiogram on 02/26/2024 indicated LVEF of 60 to 65%, no RWMA, mild concentric LVH, grade 2 diastolic dysfunction, elevated LVEDP, RV systolic function and size was normal, mildly elevated PASP, LA was moderately dilated, RA was mildly dilated mild mitral stenosis with mild mitral valve regurgitation, aortic valve sclerosis was present without evidence of stenosis, mild aortic valve regurgitation.  Farxiga  10 mg daily was added.  Today patient continues to note intermittent dyspnea on exertion, reports no improvement with Farxiga  and being cost preoperative is not inclined to continue medication.   Recommended that he discuss with his insurance carrier to see if Evone Hoh would be preferred.  Given elevated PASP and mildly elevated LVEDP we will trial Lasix 20 mg daily for the next 3 days, he will notify office if any improvement.  Question if his intermittent dyspnea is also related to his history of COPD, he will follow-up with his PCP regarding this.  Could consider starting on spironolactone however patient notes intermittent  low blood pressures, encouraged patient to continue monitoring at home.  Encouraged patient to monitor for increased lower extremity edema, increased shortness of breath, orthopnea or PND.  Continue irbesartan  150 mg daily and carvedilol  6.25 mg twice daily.  Hypertension: Initial blood pressure today 128/62, on recheck of left arm 122/78, right arm 126/78. At last office visited noted to have significant difference, not appreciated today.  Patient does note occasional low blood pressures, encouraged staying well-hydrated.  Reviewed ED precautions.  Continue carvedilol  6.25 mg twice daily and irbesartan  150 mg daily.  Hyperlipidemia: Last LDL 46, continue Zetia  10 mg daily and rosuvastatin  40 mg daily.  OSA: Uses CPAP nightly, followed by [facility].     Disposition: F/u with Dr. Chancy Comber in 3 months or sooner if needed per patient request.   Signed, Kita Neace D Duane Trias, NP

## 2024-03-17 ENCOUNTER — Ambulatory Visit: Attending: Cardiology | Admitting: Cardiology

## 2024-03-17 ENCOUNTER — Other Ambulatory Visit (HOSPITAL_BASED_OUTPATIENT_CLINIC_OR_DEPARTMENT_OTHER): Payer: Self-pay

## 2024-03-17 VITALS — BP 128/62 | HR 65 | Ht 72.0 in | Wt 214.6 lb

## 2024-03-17 DIAGNOSIS — I2510 Atherosclerotic heart disease of native coronary artery without angina pectoris: Secondary | ICD-10-CM | POA: Diagnosis not present

## 2024-03-17 DIAGNOSIS — I358 Other nonrheumatic aortic valve disorders: Secondary | ICD-10-CM

## 2024-03-17 DIAGNOSIS — G4733 Obstructive sleep apnea (adult) (pediatric): Secondary | ICD-10-CM

## 2024-03-17 DIAGNOSIS — I5189 Other ill-defined heart diseases: Secondary | ICD-10-CM

## 2024-03-17 DIAGNOSIS — E785 Hyperlipidemia, unspecified: Secondary | ICD-10-CM

## 2024-03-17 DIAGNOSIS — I6523 Occlusion and stenosis of bilateral carotid arteries: Secondary | ICD-10-CM | POA: Diagnosis not present

## 2024-03-17 DIAGNOSIS — I10 Essential (primary) hypertension: Secondary | ICD-10-CM | POA: Diagnosis not present

## 2024-03-17 MED ORDER — FUROSEMIDE 20 MG PO TABS
ORAL_TABLET | ORAL | 3 refills | Status: AC
  Filled 2024-03-17: qty 35, 35d supply, fill #0
  Filled 2024-08-10: qty 35, 35d supply, fill #1
  Filled 2024-08-13: qty 35, 35d supply, fill #0

## 2024-03-17 NOTE — Patient Instructions (Signed)
 Medication Instructions:  Take Lasix 20 mg once a day for the next 3 days then take as needed for any lower extremity swelling, SOB, or a weight gain of 3 lb overnight or 5 lbs in a week *If you need a refill on your cardiac medications before your next appointment, please call your pharmacy*  Lab Work: No labs  Testing/Procedures: No testing  Follow-Up: At [facility], you and your health needs are our priority.  As part of our continuing mission to provide you with exceptional heart care, our providers are all part of one team.  This team includes your primary Cardiologist (physician) and Advanced Practice Providers or APPs (Physician Assistants and Nurse Practitioners) who all work together to provide you with the care you need, when you need it.  Your next appointment:   3 month(s)  Provider:   Gayatri A Acharya, MD    We recommend signing up for the patient portal called MyChart.  Sign up information is provided on this After Visit Summary.  MyChart is used to connect with patients for Virtual Visits (Telemedicine).  Patients are able to view lab/test results, encounter notes, upcoming appointments, etc.  Non-urgent messages can be sent to your provider as well.   To learn more about what you can do with MyChart, go to [email or web address].

## 2024-03-18 ENCOUNTER — Encounter: Payer: Self-pay | Admitting: Cardiology

## 2024-03-26 ENCOUNTER — Other Ambulatory Visit (HOSPITAL_BASED_OUTPATIENT_CLINIC_OR_DEPARTMENT_OTHER): Payer: Self-pay

## 2024-03-26 DIAGNOSIS — K2100 Gastro-esophageal reflux disease with esophagitis, without bleeding: Secondary | ICD-10-CM | POA: Diagnosis not present

## 2024-03-26 DIAGNOSIS — Z1331 Encounter for screening for depression: Secondary | ICD-10-CM | POA: Diagnosis not present

## 2024-03-26 DIAGNOSIS — Z125 Encounter for screening for malignant neoplasm of prostate: Secondary | ICD-10-CM | POA: Diagnosis not present

## 2024-03-26 DIAGNOSIS — Z0000 Encounter for general adult medical examination without abnormal findings: Secondary | ICD-10-CM | POA: Diagnosis not present

## 2024-03-26 MED ORDER — OMEPRAZOLE 40 MG PO CPDR
40.0000 mg | DELAYED_RELEASE_CAPSULE | Freq: Every morning | ORAL | 1 refills | Status: DC
  Filled 2024-03-26: qty 90, 90d supply, fill #0
  Filled 2024-07-09: qty 90, 90d supply, fill #1

## 2024-04-13 ENCOUNTER — Other Ambulatory Visit (HOSPITAL_BASED_OUTPATIENT_CLINIC_OR_DEPARTMENT_OTHER): Payer: Self-pay

## 2024-04-19 ENCOUNTER — Other Ambulatory Visit: Payer: Self-pay

## 2024-04-19 ENCOUNTER — Other Ambulatory Visit (HOSPITAL_BASED_OUTPATIENT_CLINIC_OR_DEPARTMENT_OTHER): Payer: Self-pay

## 2024-04-20 ENCOUNTER — Other Ambulatory Visit (HOSPITAL_BASED_OUTPATIENT_CLINIC_OR_DEPARTMENT_OTHER): Payer: Self-pay

## 2024-04-20 MED ORDER — METFORMIN HCL 500 MG PO TABS
500.0000 mg | ORAL_TABLET | Freq: Two times a day (BID) | ORAL | 0 refills | Status: DC
  Filled 2024-04-20 – 2024-04-23 (×2): qty 180, 90d supply, fill #0

## 2024-04-23 ENCOUNTER — Other Ambulatory Visit (HOSPITAL_BASED_OUTPATIENT_CLINIC_OR_DEPARTMENT_OTHER): Payer: Self-pay

## 2024-05-03 ENCOUNTER — Other Ambulatory Visit (HOSPITAL_BASED_OUTPATIENT_CLINIC_OR_DEPARTMENT_OTHER): Payer: Self-pay

## 2024-05-05 DIAGNOSIS — E039 Hypothyroidism, unspecified: Secondary | ICD-10-CM | POA: Diagnosis not present

## 2024-05-07 ENCOUNTER — Other Ambulatory Visit (HOSPITAL_BASED_OUTPATIENT_CLINIC_OR_DEPARTMENT_OTHER): Payer: Self-pay

## 2024-05-07 DIAGNOSIS — L0390 Cellulitis, unspecified: Secondary | ICD-10-CM | POA: Diagnosis not present

## 2024-05-07 DIAGNOSIS — W57XXXA Bitten or stung by nonvenomous insect and other nonvenomous arthropods, initial encounter: Secondary | ICD-10-CM | POA: Diagnosis not present

## 2024-05-07 MED ORDER — CEPHALEXIN 500 MG PO CAPS
500.0000 mg | ORAL_CAPSULE | Freq: Three times a day (TID) | ORAL | 0 refills | Status: AC
  Filled 2024-05-07: qty 21, 7d supply, fill #0

## 2024-05-07 MED ORDER — PREDNISONE 50 MG PO TABS
50.0000 mg | ORAL_TABLET | Freq: Every day | ORAL | 0 refills | Status: AC
  Filled 2024-05-07: qty 5, 5d supply, fill #0

## 2024-05-07 MED ORDER — TRIAMCINOLONE ACETONIDE 0.1 % EX CREA
TOPICAL_CREAM | CUTANEOUS | 0 refills | Status: AC
  Filled 2024-05-07: qty 15, 7d supply, fill #0

## 2024-05-11 ENCOUNTER — Other Ambulatory Visit (HOSPITAL_BASED_OUTPATIENT_CLINIC_OR_DEPARTMENT_OTHER): Payer: Self-pay

## 2024-06-03 ENCOUNTER — Encounter: Payer: Self-pay | Admitting: Acute Care

## 2024-06-09 ENCOUNTER — Ambulatory Visit
Admission: RE | Admit: 2024-06-09 | Discharge: 2024-06-09 | Disposition: A | Discharge status: Home / Self Care | Source: Ambulatory Visit | Attending: Family Medicine | Admitting: Family Medicine

## 2024-06-09 DIAGNOSIS — Z87891 Personal history of nicotine dependence: Secondary | ICD-10-CM | POA: Diagnosis not present

## 2024-06-09 DIAGNOSIS — Z122 Encounter for screening for malignant neoplasm of respiratory organs: Secondary | ICD-10-CM

## 2024-06-21 ENCOUNTER — Other Ambulatory Visit: Payer: Self-pay

## 2024-06-21 DIAGNOSIS — Z122 Encounter for screening for malignant neoplasm of respiratory organs: Secondary | ICD-10-CM

## 2024-06-21 DIAGNOSIS — Z87891 Personal history of nicotine dependence: Secondary | ICD-10-CM

## 2024-06-23 ENCOUNTER — Other Ambulatory Visit: Payer: Self-pay

## 2024-06-23 ENCOUNTER — Other Ambulatory Visit (HOSPITAL_BASED_OUTPATIENT_CLINIC_OR_DEPARTMENT_OTHER): Payer: Self-pay

## 2024-06-25 ENCOUNTER — Encounter: Payer: Self-pay | Admitting: *Deleted

## 2024-06-29 ENCOUNTER — Other Ambulatory Visit (HOSPITAL_COMMUNITY): Payer: Self-pay

## 2024-06-29 ENCOUNTER — Ambulatory Visit: Attending: Internal Medicine | Admitting: Internal Medicine

## 2024-06-29 ENCOUNTER — Other Ambulatory Visit (HOSPITAL_BASED_OUTPATIENT_CLINIC_OR_DEPARTMENT_OTHER): Payer: Self-pay

## 2024-06-29 ENCOUNTER — Encounter: Payer: Self-pay | Admitting: Internal Medicine

## 2024-06-29 VITALS — BP 169/86 | HR 73 | Ht 72.0 in | Wt 209.2 lb

## 2024-06-29 DIAGNOSIS — I358 Other nonrheumatic aortic valve disorders: Secondary | ICD-10-CM

## 2024-06-29 DIAGNOSIS — E785 Hyperlipidemia, unspecified: Secondary | ICD-10-CM | POA: Diagnosis not present

## 2024-06-29 DIAGNOSIS — Z79899 Other long term (current) drug therapy: Secondary | ICD-10-CM

## 2024-06-29 DIAGNOSIS — I340 Nonrheumatic mitral (valve) insufficiency: Secondary | ICD-10-CM

## 2024-06-29 DIAGNOSIS — I10 Essential (primary) hypertension: Secondary | ICD-10-CM

## 2024-06-29 DIAGNOSIS — I2510 Atherosclerotic heart disease of native coronary artery without angina pectoris: Secondary | ICD-10-CM | POA: Diagnosis not present

## 2024-06-29 MED ORDER — IRBESARTAN 75 MG PO TABS
75.0000 mg | ORAL_TABLET | Freq: Every day | ORAL | 3 refills | Status: AC
  Filled 2024-06-29: qty 90, 90d supply, fill #0

## 2024-06-29 NOTE — Patient Instructions (Signed)
 Medication Instructions:  Decrease Irbesartan  to 75 mg daily *If you need a refill on your cardiac medications before your next appointment, please call your pharmacy*  Lab Work: None ordered If you have labs (blood work) drawn today and your tests are completely normal, you will receive your results only by: MyChart Message (if you have MyChart) OR A paper copy in the mail If you have any lab test that is abnormal or we need to change your treatment, we will call you to review the results.  Follow-Up: At [facility], you and your health needs are our priority.  As part of our continuing mission to provide you with exceptional heart care, our providers are all part of one team.  This team includes your primary Cardiologist (physician) and Advanced Practice Providers or APPs (Physician Assistants and Nurse Practitioners) who all work together to provide you with the care you need, when you need it.  Your next appointment:   6 month(s)  Provider:   Gayatri A Acharya, MD or APP

## 2024-06-29 NOTE — Progress Notes (Signed)
 Cardiology Office Note:  .   Date:  06/29/2024  ID:  Justin Waters, DOB [birth year], MRN [id number] PCP: Aisha Harvey, MD  [facility] Providers Cardiologist:  Soyla DELENA Merck, MD    History of Present Illness: .   Justin Waters is a 73 y.o. male.  Discussed the use of AI scribe software for clinical note transcription with the patient, who gave verbal consent to proceed.  History of Present Illness Justin Waters is a 73 year old male with hypertension who presents with fluctuating blood pressure readings.  He experiences fluctuating blood pressure, initially with hypotension causing faintness, leading to a reduction in carvedilol  and irbesartan . This stabilized his blood pressure to around 140 mmHg upon waking. Recently, his blood pressure spiked without identifiable triggers, prompting a return to the full dose of irbesartan , resulting in readings generally in the 120s/70s to 80s, with occasional spikes to the 140s. He tolerates blood pressure around 100 mmHg but feels unwell if it drops lower.  Current medications include irbesartan  75 mg daily, ezetimibe  10 mg daily, levothyroxine , rosuvastatin  40 mg daily, and a daily baby aspirin . He does not take furosemide  due to the absence of significant swelling or shortness of breath. No recent weight loss or other changes that might explain the fluctuations in blood pressure. Occasional anxiety is noted.    ROS: negative except per HPI above.  Studies Reviewed: .        Results LABS Cholesterol: Optimal (12/2023) Complete blood count: Normal Renal function: Slightly abnormal Liver function tests: Within normal limits Platelet count: Within normal limits  DIAGNOSTIC Echocardiogram: Normal ejection fraction, mild mitral regurgitation (07/2023) Risk Assessment/Calculations:       Physical Exam:   VS:  BP (!) 169/86   Pulse 73   Ht 6' (1.829 m)   Wt 209 lb 3.2 oz (94.9 kg)   SpO2 98%   BMI 28.37 kg/m    Wt  Readings from Last 3 Encounters:  06/29/24 209 lb 3.2 oz (94.9 kg)  03/17/24 214 lb 9.6 oz (97.3 kg)  02/05/24 212 lb (96.2 kg)     Physical Exam VITALS: BP- 179/89 GENERAL: Alert, cooperative, well developed, no acute distress. HEENT: Normocephalic, normal oropharynx, moist mucous membranes. CHEST: Clear to auscultation bilaterally, no wheezes, rhonchi, or crackles. CARDIOVASCULAR: Normal heart rate and rhythm, S1 and S2 normal without murmurs. ABDOMEN: Soft, non-tender, non-distended, without organomegaly, normal bowel sounds. EXTREMITIES: No cyanosis or edema. NEUROLOGICAL: Cranial nerves grossly intact, moves all extremities without gross motor or sensory deficit.   ASSESSMENT AND PLAN: .    Assessment and Plan Assessment & Plan Hypertension with medication adjustment Hypertension with recent fluctuations. Home readings generally 120s/70s, occasional spikes to 140s. Office reading 179/89 likely spurious. Anxiety considered as a factor. Echocardiogram normal, ruling out heart failure. - Continue irbesartan  75 mg daily. Call in prescription for irbesartan  75 mg to [facility] for one year. - Recheck blood pressure before leaving the office. - Follow up in 6 months to monitor blood pressure control.  Mitral valve regurgitation, mild, with normal cardiac function Mild mitral valve regurgitation with normal cardiac function. Echocardiogram shows normal pumping function.  Aortic valve calcification without significant stenosis Aortic valve calcification without significant stenosis. - Monitor for changes in heart murmur or symptoms.  Hyperlipidemia, well-controlled Moderate CAD Hyperlipidemia well-controlled with current regimen. Recent cholesterol levels excellent. - Continue current cholesterol management regimen of crestor  40 mg daily and zetia  10 mg daily.  - continue ASA 81 mg  daily  Hypothyroidism, stable Hypothyroidism with current treatment. - Continue current  thyroid  management regimen.      Soyla Merck, MD, FACC

## 2024-07-12 DIAGNOSIS — R7303 Prediabetes: Secondary | ICD-10-CM | POA: Diagnosis not present

## 2024-07-12 DIAGNOSIS — R413 Other amnesia: Secondary | ICD-10-CM | POA: Diagnosis not present

## 2024-07-12 DIAGNOSIS — E039 Hypothyroidism, unspecified: Secondary | ICD-10-CM | POA: Diagnosis not present

## 2024-07-12 DIAGNOSIS — I10 Essential (primary) hypertension: Secondary | ICD-10-CM | POA: Diagnosis not present

## 2024-07-12 DIAGNOSIS — F419 Anxiety disorder, unspecified: Secondary | ICD-10-CM | POA: Diagnosis not present

## 2024-07-12 DIAGNOSIS — Z79899 Other long term (current) drug therapy: Secondary | ICD-10-CM | POA: Diagnosis not present

## 2024-07-12 DIAGNOSIS — E7800 Pure hypercholesterolemia, unspecified: Secondary | ICD-10-CM | POA: Diagnosis not present

## 2024-07-12 DIAGNOSIS — Z23 Encounter for immunization: Secondary | ICD-10-CM | POA: Diagnosis not present

## 2024-07-12 DIAGNOSIS — M545 Low back pain, unspecified: Secondary | ICD-10-CM | POA: Diagnosis not present

## 2024-07-14 ENCOUNTER — Other Ambulatory Visit (HOSPITAL_BASED_OUTPATIENT_CLINIC_OR_DEPARTMENT_OTHER): Payer: Self-pay

## 2024-07-15 ENCOUNTER — Other Ambulatory Visit: Payer: Self-pay

## 2024-07-15 ENCOUNTER — Other Ambulatory Visit (HOSPITAL_BASED_OUTPATIENT_CLINIC_OR_DEPARTMENT_OTHER): Payer: Self-pay

## 2024-07-15 MED ORDER — EZETIMIBE 10 MG PO TABS
10.0000 mg | ORAL_TABLET | Freq: Every day | ORAL | 4 refills | Status: AC
  Filled 2024-10-05: qty 90, 90d supply, fill #0

## 2024-07-15 MED ORDER — LEVOTHYROXINE SODIUM 50 MCG PO TABS
ORAL_TABLET | ORAL | 1 refills | Status: AC
  Filled 2024-07-15: qty 100, 84d supply, fill #0
  Filled 2024-10-05: qty 38, 32d supply, fill #1

## 2024-07-15 MED ORDER — IRBESARTAN 75 MG PO TABS
75.0000 mg | ORAL_TABLET | Freq: Every day | ORAL | 4 refills | Status: AC
  Filled 2024-07-23 – 2024-09-20 (×4): qty 90, 90d supply, fill #0

## 2024-07-15 MED ORDER — GABAPENTIN 400 MG PO CAPS
400.0000 mg | ORAL_CAPSULE | Freq: Two times a day (BID) | ORAL | 5 refills | Status: AC
  Filled 2024-07-15: qty 60, 30d supply, fill #0
  Filled 2024-08-10 – 2024-08-14 (×2): qty 60, 30d supply, fill #1
  Filled 2024-09-20: qty 60, 30d supply, fill #2
  Filled 2024-10-19: qty 60, 30d supply, fill #3

## 2024-07-15 MED ORDER — IRBESARTAN 75 MG PO TABS
75.0000 mg | ORAL_TABLET | Freq: Every day | ORAL | 4 refills | Status: AC
  Filled 2024-07-15: qty 90, 90d supply, fill #0

## 2024-07-23 ENCOUNTER — Other Ambulatory Visit (HOSPITAL_BASED_OUTPATIENT_CLINIC_OR_DEPARTMENT_OTHER): Payer: Self-pay

## 2024-07-23 ENCOUNTER — Other Ambulatory Visit: Payer: Self-pay

## 2024-07-26 ENCOUNTER — Other Ambulatory Visit (HOSPITAL_BASED_OUTPATIENT_CLINIC_OR_DEPARTMENT_OTHER): Payer: Self-pay

## 2024-07-26 MED ORDER — METFORMIN HCL 500 MG PO TABS
500.0000 mg | ORAL_TABLET | Freq: Two times a day (BID) | ORAL | 1 refills | Status: AC
  Filled 2024-07-26: qty 180, 90d supply, fill #0

## 2024-07-28 ENCOUNTER — Other Ambulatory Visit (HOSPITAL_BASED_OUTPATIENT_CLINIC_OR_DEPARTMENT_OTHER): Payer: Self-pay

## 2024-07-28 DIAGNOSIS — S50861A Insect bite (nonvenomous) of right forearm, initial encounter: Secondary | ICD-10-CM | POA: Diagnosis not present

## 2024-07-28 DIAGNOSIS — S20361A Insect bite (nonvenomous) of right front wall of thorax, initial encounter: Secondary | ICD-10-CM | POA: Diagnosis not present

## 2024-07-28 DIAGNOSIS — A6920 Lyme disease, unspecified: Secondary | ICD-10-CM | POA: Diagnosis not present

## 2024-07-28 MED ORDER — DOXYCYCLINE HYCLATE 100 MG PO TABS
100.0000 mg | ORAL_TABLET | Freq: Two times a day (BID) | ORAL | 0 refills | Status: AC
  Filled 2024-07-28: qty 14, 7d supply, fill #0

## 2024-08-03 ENCOUNTER — Other Ambulatory Visit (HOSPITAL_COMMUNITY): Payer: Self-pay

## 2024-08-03 ENCOUNTER — Other Ambulatory Visit (HOSPITAL_BASED_OUTPATIENT_CLINIC_OR_DEPARTMENT_OTHER): Payer: Self-pay

## 2024-08-03 DIAGNOSIS — L2489 Irritant contact dermatitis due to other agents: Secondary | ICD-10-CM | POA: Diagnosis not present

## 2024-08-03 MED ORDER — TRIAMCINOLONE ACETONIDE 0.025 % EX OINT
TOPICAL_OINTMENT | CUTANEOUS | 0 refills | Status: AC
  Filled 2024-08-03: qty 30, 30d supply, fill #0
  Filled 2024-08-03: qty 30, 10d supply, fill #0

## 2024-08-04 ENCOUNTER — Other Ambulatory Visit (HOSPITAL_COMMUNITY): Payer: Self-pay

## 2024-08-10 ENCOUNTER — Other Ambulatory Visit: Payer: Self-pay

## 2024-08-10 ENCOUNTER — Other Ambulatory Visit (HOSPITAL_BASED_OUTPATIENT_CLINIC_OR_DEPARTMENT_OTHER): Payer: Self-pay

## 2024-08-11 ENCOUNTER — Other Ambulatory Visit (HOSPITAL_BASED_OUTPATIENT_CLINIC_OR_DEPARTMENT_OTHER): Payer: Self-pay

## 2024-08-11 MED ORDER — TAMSULOSIN HCL 0.4 MG PO CAPS
0.4000 mg | ORAL_CAPSULE | Freq: Every day | ORAL | 1 refills | Status: AC
  Filled 2024-08-11 – 2024-08-13 (×2): qty 90, 90d supply, fill #0

## 2024-08-13 ENCOUNTER — Other Ambulatory Visit (HOSPITAL_BASED_OUTPATIENT_CLINIC_OR_DEPARTMENT_OTHER): Payer: Self-pay

## 2024-08-13 ENCOUNTER — Other Ambulatory Visit (HOSPITAL_COMMUNITY): Payer: Self-pay

## 2024-08-14 ENCOUNTER — Other Ambulatory Visit (HOSPITAL_COMMUNITY): Payer: Self-pay

## 2024-08-16 ENCOUNTER — Other Ambulatory Visit (HOSPITAL_COMMUNITY): Payer: Self-pay

## 2024-08-17 ENCOUNTER — Other Ambulatory Visit: Payer: Self-pay

## 2024-09-14 ENCOUNTER — Other Ambulatory Visit (HOSPITAL_BASED_OUTPATIENT_CLINIC_OR_DEPARTMENT_OTHER): Payer: Self-pay

## 2024-09-20 ENCOUNTER — Other Ambulatory Visit (HOSPITAL_BASED_OUTPATIENT_CLINIC_OR_DEPARTMENT_OTHER): Payer: Self-pay

## 2024-09-20 ENCOUNTER — Other Ambulatory Visit: Payer: Self-pay

## 2024-09-21 ENCOUNTER — Other Ambulatory Visit (HOSPITAL_BASED_OUTPATIENT_CLINIC_OR_DEPARTMENT_OTHER): Payer: Self-pay

## 2024-09-21 MED ORDER — OMEPRAZOLE 40 MG PO CPDR
40.0000 mg | DELAYED_RELEASE_CAPSULE | Freq: Every day | ORAL | 0 refills | Status: AC
  Filled 2024-09-21: qty 90, 90d supply, fill #0

## 2024-09-24 ENCOUNTER — Other Ambulatory Visit (HOSPITAL_COMMUNITY): Payer: Self-pay

## 2024-09-28 DIAGNOSIS — K080 Exfoliation of teeth due to systemic causes: Secondary | ICD-10-CM | POA: Diagnosis not present

## 2024-10-05 ENCOUNTER — Other Ambulatory Visit (HOSPITAL_COMMUNITY): Payer: Self-pay

## 2024-10-06 ENCOUNTER — Other Ambulatory Visit (HOSPITAL_COMMUNITY): Payer: Self-pay

## 2024-10-06 ENCOUNTER — Other Ambulatory Visit: Payer: Self-pay

## 2024-10-08 ENCOUNTER — Other Ambulatory Visit: Payer: Self-pay

## 2024-10-19 ENCOUNTER — Other Ambulatory Visit: Payer: Self-pay
